# Patient Record
Sex: Female | Born: 1970
Health system: Southern US, Community
[De-identification: ages and names within clinical notes are randomized; demographics above are authoritative.]

## PROBLEM LIST (undated history)

## (undated) DIAGNOSIS — I10 Essential (primary) hypertension: Secondary | ICD-10-CM

## (undated) HISTORY — PX: ABDOMINAL HYSTERECTOMY: SHX81

## (undated) HISTORY — PX: HAND SURGERY: SHX662

## (undated) HISTORY — PX: NECK SURGERY: SHX720

## (undated) HISTORY — PX: APPENDECTOMY: SHX54

---

## 1998-05-20 ENCOUNTER — Emergency Department (HOSPITAL_COMMUNITY): Admission: EM | Admit: 1998-05-20 | Discharge: 1998-05-20 | Payer: Self-pay | Admitting: Emergency Medicine

## 2002-05-20 ENCOUNTER — Emergency Department (HOSPITAL_COMMUNITY): Admission: EM | Admit: 2002-05-20 | Discharge: 2002-05-20 | Payer: Self-pay | Admitting: Emergency Medicine

## 2002-05-20 ENCOUNTER — Encounter: Payer: Self-pay | Admitting: Emergency Medicine

## 2002-09-03 ENCOUNTER — Other Ambulatory Visit: Admission: RE | Admit: 2002-09-03 | Discharge: 2002-09-03 | Payer: Self-pay | Admitting: Obstetrics & Gynecology

## 2003-09-17 ENCOUNTER — Other Ambulatory Visit: Admission: RE | Admit: 2003-09-17 | Discharge: 2003-09-17 | Payer: Self-pay | Admitting: Obstetrics and Gynecology

## 2003-12-30 ENCOUNTER — Emergency Department (HOSPITAL_COMMUNITY): Admission: EM | Admit: 2003-12-30 | Discharge: 2003-12-30 | Payer: Self-pay | Admitting: Emergency Medicine

## 2004-10-07 ENCOUNTER — Other Ambulatory Visit: Admission: RE | Admit: 2004-10-07 | Discharge: 2004-10-07 | Payer: Self-pay | Admitting: Family Medicine

## 2005-01-20 ENCOUNTER — Encounter: Admission: RE | Admit: 2005-01-20 | Discharge: 2005-01-20 | Payer: Self-pay | Admitting: Neurological Surgery

## 2005-07-11 ENCOUNTER — Encounter
Admission: RE | Admit: 2005-07-11 | Discharge: 2005-07-11 | Payer: Self-pay | Admitting: Physical Medicine and Rehabilitation

## 2005-08-15 ENCOUNTER — Encounter
Admission: RE | Admit: 2005-08-15 | Discharge: 2005-08-15 | Payer: Self-pay | Admitting: Physical Medicine and Rehabilitation

## 2005-08-25 ENCOUNTER — Encounter
Admission: RE | Admit: 2005-08-25 | Discharge: 2005-08-25 | Payer: Self-pay | Admitting: Physical Medicine and Rehabilitation

## 2005-09-29 ENCOUNTER — Emergency Department (HOSPITAL_COMMUNITY): Admission: EM | Admit: 2005-09-29 | Discharge: 2005-09-29 | Payer: Self-pay | Admitting: Emergency Medicine

## 2006-01-05 ENCOUNTER — Other Ambulatory Visit: Admission: RE | Admit: 2006-01-05 | Discharge: 2006-01-05 | Payer: Self-pay | Admitting: Family Medicine

## 2006-02-20 ENCOUNTER — Encounter
Admission: RE | Admit: 2006-02-20 | Discharge: 2006-02-20 | Payer: Self-pay | Admitting: Physical Medicine and Rehabilitation

## 2006-03-10 ENCOUNTER — Ambulatory Visit (HOSPITAL_COMMUNITY): Admission: RE | Admit: 2006-03-10 | Discharge: 2006-03-11 | Payer: Self-pay | Admitting: Neurosurgery

## 2006-06-20 ENCOUNTER — Ambulatory Visit (HOSPITAL_COMMUNITY): Admission: RE | Admit: 2006-06-20 | Discharge: 2006-06-20 | Payer: Self-pay | Admitting: Obstetrics and Gynecology

## 2008-04-23 ENCOUNTER — Inpatient Hospital Stay (HOSPITAL_COMMUNITY): Admission: EM | Admit: 2008-04-23 | Discharge: 2008-04-25 | Payer: Self-pay | Admitting: Emergency Medicine

## 2009-09-17 ENCOUNTER — Ambulatory Visit: Payer: Self-pay | Admitting: Diagnostic Radiology

## 2009-09-17 ENCOUNTER — Emergency Department (HOSPITAL_BASED_OUTPATIENT_CLINIC_OR_DEPARTMENT_OTHER): Admission: EM | Admit: 2009-09-17 | Discharge: 2009-09-17 | Payer: Self-pay | Admitting: Emergency Medicine

## 2009-10-28 ENCOUNTER — Emergency Department (HOSPITAL_BASED_OUTPATIENT_CLINIC_OR_DEPARTMENT_OTHER): Admission: EM | Admit: 2009-10-28 | Discharge: 2009-10-28 | Payer: Self-pay | Admitting: Emergency Medicine

## 2010-04-22 ENCOUNTER — Encounter (INDEPENDENT_AMBULATORY_CARE_PROVIDER_SITE_OTHER): Payer: Self-pay | Admitting: Obstetrics and Gynecology

## 2010-04-22 ENCOUNTER — Ambulatory Visit (HOSPITAL_COMMUNITY): Admission: RE | Admit: 2010-04-22 | Discharge: 2010-04-23 | Payer: Self-pay | Admitting: Obstetrics and Gynecology

## 2010-04-28 ENCOUNTER — Inpatient Hospital Stay (HOSPITAL_COMMUNITY)
Admission: AD | Admit: 2010-04-28 | Discharge: 2010-04-30 | Payer: Self-pay | Source: Home / Self Care | Admitting: Obstetrics and Gynecology

## 2010-04-28 ENCOUNTER — Encounter: Payer: Self-pay | Admitting: Obstetrics and Gynecology

## 2010-05-06 ENCOUNTER — Inpatient Hospital Stay (HOSPITAL_COMMUNITY)
Admission: AD | Admit: 2010-05-06 | Discharge: 2010-05-06 | Payer: Self-pay | Source: Home / Self Care | Admitting: Obstetrics & Gynecology

## 2010-07-03 ENCOUNTER — Encounter: Payer: Self-pay | Admitting: Obstetrics and Gynecology

## 2010-07-04 ENCOUNTER — Encounter: Payer: Self-pay | Admitting: Obstetrics and Gynecology

## 2010-08-24 LAB — COMPREHENSIVE METABOLIC PANEL
ALT: 18 U/L (ref 0–35)
AST: 19 U/L (ref 0–37)
Albumin: 3 g/dL — ABNORMAL LOW (ref 3.5–5.2)
Albumin: 3.6 g/dL (ref 3.5–5.2)
Alkaline Phosphatase: 76 U/L (ref 39–117)
BUN: 4 mg/dL — ABNORMAL LOW (ref 6–23)
BUN: 6 mg/dL (ref 6–23)
CO2: 29 mEq/L (ref 19–32)
Calcium: 9.2 mg/dL (ref 8.4–10.5)
Chloride: 102 mEq/L (ref 96–112)
Chloride: 105 mEq/L (ref 96–112)
Creatinine, Ser: 0.83 mg/dL (ref 0.4–1.2)
Creatinine, Ser: 0.85 mg/dL (ref 0.4–1.2)
Creatinine, Ser: 0.97 mg/dL (ref 0.4–1.2)
GFR calc Af Amer: >60 mL/min (ref >60)
GFR calc non Af Amer: >60 mL/min (ref >60)
Glucose, Bld: 128 mg/dL — ABNORMAL HIGH (ref 70–99)
Glucose, Bld: 98 mg/dL (ref 70–99)
Potassium: 3.5 mEq/L (ref 3.5–5.1)
Potassium: 4 mEq/L (ref 3.5–5.1)
Potassium: 4.4 mEq/L (ref 3.5–5.1)
Sodium: 140 mEq/L (ref 135–145)
Total Bilirubin: 0.2 mg/dL — ABNORMAL LOW (ref 0.3–1.2)
Total Bilirubin: 0.6 mg/dL (ref 0.3–1.2)
Total Protein: 6.3 g/dL (ref 6.0–8.3)

## 2010-08-24 LAB — DIFFERENTIAL
Basophils Absolute: 0 10*3/uL (ref 0.0–0.1)
Basophils Absolute: 0 10*3/uL (ref 0.0–0.1)
Eosinophils Absolute: 0.4 10*3/uL (ref 0.0–0.7)
Eosinophils Relative: 4 % (ref 0–5)
Eosinophils Relative: 7 % — ABNORMAL HIGH (ref 0–5)
Lymphocytes Relative: 14 % (ref 12–46)
Lymphocytes Relative: 26 % (ref 12–46)
Monocytes Absolute: 0.3 10*3/uL (ref 0.1–1.0)
Monocytes Absolute: 0.5 10*3/uL (ref 0.1–1.0)
Monocytes Relative: 4 % (ref 3–12)
Neutro Abs: 6.1 10*3/uL (ref 1.7–7.7)

## 2010-08-24 LAB — URINE CULTURE
Colony Count: NO GROWTH
Culture  Setup Time: 201111171542
Culture: NO GROWTH
Special Requests: NEGATIVE

## 2010-08-24 LAB — URINE MICROSCOPIC-ADD ON

## 2010-08-24 LAB — CBC
HCT: 36.4 % (ref 36.0–46.0)
HCT: 38.2 % (ref 36.0–46.0)
Hemoglobin: 10.8 g/dL — ABNORMAL LOW (ref 12.0–15.0)
Hemoglobin: 12.2 g/dL (ref 12.0–15.0)
Hemoglobin: 12.8 g/dL (ref 12.0–15.0)
MCH: 28.8 pg (ref 26.0–34.0)
MCH: 28.8 pg (ref 26.0–34.0)
MCHC: 33.4 g/dL (ref 30.0–36.0)
MCHC: 33.4 g/dL (ref 30.0–36.0)
MCHC: 33.5 g/dL (ref 30.0–36.0)
MCV: 85.5 fL (ref 78.0–100.0)
MCV: 85.9 fL (ref 78.0–100.0)
Platelets: 209 10*3/uL (ref 150–400)
Platelets: 229 10*3/uL (ref 150–400)
RBC: 3.67 MIL/uL — ABNORMAL LOW (ref 3.87–5.11)
RBC: 4.43 MIL/uL (ref 3.87–5.11)
RDW: 13.7 % (ref 11.5–15.5)
RDW: 13.8 % (ref 11.5–15.5)
RDW: 14.1 % (ref 11.5–15.5)
WBC: 10.4 10*3/uL (ref 4.0–10.5)
WBC: 6.6 10*3/uL (ref 4.0–10.5)
WBC: 7.7 10*3/uL (ref 4.0–10.5)
WBC: 7.9 10*3/uL (ref 4.0–10.5)

## 2010-08-24 LAB — URINALYSIS, ROUTINE W REFLEX MICROSCOPIC
Glucose, UA: NEGATIVE mg/dL
Glucose, UA: NEGATIVE mg/dL
Ketones, ur: NEGATIVE mg/dL
Leukocytes, UA: NEGATIVE
Nitrite: NEGATIVE
Protein, ur: NEGATIVE mg/dL
Protein, ur: NEGATIVE mg/dL
Specific Gravity, Urine: 1.01 (ref 1.005–1.030)
Specific Gravity, Urine: <1.005 — ABNORMAL LOW (ref 1.005–1.030)
Urobilinogen, UA: 0.2 mg/dL (ref 0.0–1.0)

## 2010-08-24 LAB — SURGICAL PCR SCREEN: Staphylococcus aureus: NEGATIVE

## 2010-08-24 LAB — PREGNANCY, URINE: Preg Test, Ur: NEGATIVE

## 2010-08-30 LAB — DIFFERENTIAL
Basophils Absolute: 0 10*3/uL (ref 0.0–0.1)
Basophils Relative: 1 % (ref 0–1)
Monocytes Relative: 6 % (ref 3–12)
Neutro Abs: 5.9 10*3/uL (ref 1.7–7.7)
Neutrophils Relative %: 72 % (ref 43–77)

## 2010-08-30 LAB — BASIC METABOLIC PANEL
CO2: 26 mEq/L (ref 19–32)
Calcium: 8.9 mg/dL (ref 8.4–10.5)
Creatinine, Ser: 0.8 mg/dL (ref 0.4–1.2)
GFR calc Af Amer: >60 mL/min (ref >60)

## 2010-08-30 LAB — CBC
MCHC: 33.1 g/dL (ref 30.0–36.0)
RBC: 4.69 MIL/uL (ref 3.87–5.11)
WBC: 8.3 10*3/uL (ref 4.0–10.5)

## 2010-08-30 LAB — URINALYSIS, ROUTINE W REFLEX MICROSCOPIC
Glucose, UA: NEGATIVE mg/dL
Specific Gravity, Urine: 1.014 (ref 1.005–1.030)
pH: 5.5 (ref 5.0–8.0)

## 2010-08-30 LAB — URINE MICROSCOPIC-ADD ON

## 2010-08-30 LAB — URINE CULTURE: Colony Count: 60000

## 2010-09-01 LAB — URINALYSIS, ROUTINE W REFLEX MICROSCOPIC
Bilirubin Urine: NEGATIVE
Glucose, UA: NEGATIVE mg/dL
Ketones, ur: NEGATIVE mg/dL
Protein, ur: NEGATIVE mg/dL

## 2010-09-01 LAB — URINE MICROSCOPIC-ADD ON

## 2010-10-26 NOTE — H&P (Signed)
Amber Cuevas, Amber Cuevas               ACCOUNT NO.:  [id number]   MEDICAL RECORD NO.:  [id number]          PATIENT TYPE:  INP   LOCATION:  3023                         FACILITY:  [facility]   PHYSICIAN:  Kela Millin, M.D.DATE OF BIRTH:  August 01, 1970   DATE OF ADMISSION:  04/23/2008  DATE OF DISCHARGE:                              HISTORY & PHYSICAL   PRIMARY CARE PHYSICIAN:  Dr. Joselyn Arrow.   CHIEF COMPLAINT:  Worsening low back pain and fever.   HISTORY OF PRESENT ILLNESS:  The patient is a 40 year old black female  with history of low back pain for the past several months, recently  diagnosed with urinary tract infection, depression and she followed up  with her primary care physician today and had a fever up to 101 per her  report, and a urinalysis was done at the office which revealed  hematuria, and so she was asked to come to the ER.  She reports that her  low back pain has worsened in the past couple of days, about 7/10 in  intensity and now radiating down her left lower extremity, mostly  (although sometimes radiates down both legs).  She denies any trauma, it  is noted that she has a history of C5-C6 herniated disk and status post  surgery per Dr. Gerlene Fee in September 2007.  [name] also reports that  5 days ago she saw the PA at her PCP's office and was diagnosed with  depression/anxiety and was started on Lexapro, and she thinks lorazepam  as well.  Prior to that she was having problems with decreased p.o.  intake and just not feeling well overall.  That same day after seeing  the PA, she later had to go to the urgent care clinic because she was  continuing to be symptomatic.  A urinalysis was done at the Urgent  Care/walk-in clinic per the patient's report, and she was told she had a  urinary tract infection and started on an antibiotic (she does not  remember its name).  Shortly after she began taking the antidepressant  and antibiotic, she began having nausea and  vomiting.  She states that  the nausea and vomiting has continued for the past 5 days - about three  times a day which is usually whenever she tries to eat anything.  She  states that it is nonbloody.  She went for follow-up today as already  mentioned and was found to be febrile and a urinalysis was repeated  which showed hematuria.  So, she was asked to come to the ED as already  mentioned above.  In the emergency room she had a urinalysis done which  showed moderate hemoglobin, with only 0-2 RBCs noted, with a specific  gravity of 1.035 and otherwise negative for infection, her white cell  count was 2.4 and a CT scan of the abdomen and pelvis was done which  showed no acute abdominal findings.  No nephrolithiasis also no  hydronephrosis.  She appeared clinically volume depleted and was  continuing to vomit in the ER, and so is admitted for further evaluation  and  management.  She denies cough, dysuria, diarrhea, hematemesis,  melena and no hematochezia.   PAST MEDICAL HISTORY:  1. As above.  2. History of C5-C6 right herniated disk - status post surgery.   MEDICATIONS:  1. Lexapro.  2. (?) Lorazepam.  3. Antibiotic - does not know the name, has been taking it for the      past 5 days.   ALLERGIES:  NKDA.   SOCIAL HISTORY:  She denies tobacco.  She also denies alcohol.   FAMILY HISTORY:  Positive for diabetes in her dad and sister, and one  sister also with hypertension.  Her mother died of stomach cancer.   REVIEW OF SYSTEMS:  As per HPI, other review of systems negative.   PHYSICAL EXAMINATION:  GENERAL:  The patient is a young black female,  appears to be in moderate distress secondary to pain, no respiratory  distress.  VITAL SIGNS:  Her temperature is 98.8, blood pressure 101/72, pulse is  98, respiratory rate is 20, O2 sat is 99%.  HEENT:  PERRL, EOMI, dry mucous membranes, no oral exudates.  NECK:  Supple, no adenopathy, no thyromegaly and no JVD.  LUNGS:  Clear to  auscultation bilaterally.  No crackles or wheezes.  CARDIOVASCULAR:  Regular rate and rhythm.  Normal S1-S2.  ABDOMEN:  Soft, bowel sounds present, nontender, nondistended.  No  organomegaly and no masses palpable.  BACK:  Tender to palpation in lumbar spine, also paraspinal tenderness  in the lumbar area, greater on the left.  EXTREMITIES:  No cyanosis and no edema.  NEUROLOGICAL:  She is alert and oriented x3.  Cranial Nerves: II to XII  grossly intact.  Her strength is 4-5/5 and symmetric.  Sensory grossly  intact.  Pain with straight leg raise to about 45 degrees on the right,  and to about 40 degrees on the left.   LABORATORY DATA:  CT scan as per HPI.  Her white cell count is 2.4,  hemoglobin is 14.28 not to rate is 42.5, platelet count is 162,  neutrophil count is 77%.  Sodium is 136, potassium is 3.7, chloride is  105, CO2 is 22, glucose 90, BUN is 8, creatinine is 1.06, calcium is  9.1, AST is 24, ALT is 24, albumin is 4.2.   Urinalysis as per HPI.   ASSESSMENT AND PLAN:  1. Worsening low back pain with fever at the office.  As discussed      above, the patient recently diagnosed with urinary tract infection      and status post antibiotics times 5 days.  Urinalysis now      unremarkable for infection and a CT scan of abdomen and pelvis is      negative as above.  Will obtain MRI of lumbar spine, recheck urine      cultures, hold off antibiotics for now as she is hemodynamically      stable, follow and further manage as appropriate pending above      studies.  2. Nausea and vomiting with the patient clinically volume depleted.      (?)Secondary to above versus medications as discussed above.      Again, the CT scan of the abdomen and pelvis without contrast is      negative.  Will obtain serum lipase, hold Lexapro for now and then      follow above studies.  3. Depression/anxiety.  Continue Ativan p.r.n., hold Lexapro for now.      Follow and consider all tentative  antidepressant.  4. Recent urinary tract infection.  As above, follow recheck cultures      and hold off antibiotics for now as discussed above.  5. History of C5-C6 herniated disk.  Status post surgery in 2007 per      Dr. Gerlene Fee.  6. Leukopenia.  As above, follow urine cultures, obtain blood cultures      if temperature greater than 100.4, also follow and recheck white      cell count and further evaluate/manage as appropriate.      Kela Millin, M.D.  Electronically Signed     ACV/MEDQ  D:  04/24/2008  T:  04/24/2008  Job:  [id number]   cc:   Lavonda Jumbo, M.D.

## 2010-10-29 NOTE — Op Note (Signed)
Amber Cuevas, Amber Cuevas NO.:  [id number]   MEDICAL RECORD NO.:  [id number]          PATIENT TYPE:  OIB   LOCATION:  3023                         FACILITY:  [facility]   PHYSICIAN:  Reinaldo Meeker, M.D. DATE OF BIRTH:  1971/01/16   DATE OF PROCEDURE:  DATE OF DISCHARGE:  03/11/2006                                 OPERATIVE REPORT   DATE OF SURGERY:  March 10, 2006.   PREOPERATIVE DIAGNOSIS:  Herniated disk C5-6 right.   POSTOPERATIVE DIAGNOSIS:  Herniated disk, C5-6 right.   PROCEDURE PERFORMED:  C5-6 anterior cervical diskectomy with bone bank  fusion, followed by Mystique anterior cervical plating.   SURGEON:  Reinaldo Meeker, M.D.   ASSISTANT:  Tia Alert, MD   PROCEDURE IN DETAIL:  After being placed in the supine position in 5 pounds  halter traction, the patient's neck was prepped and draped in the usual  sterile fashion.  Localizing x-rays taken prior to incision identified the  appropriate level.  A transverse incision was made in the right anterior  neck starting at the midline and heading towards the medial aspect of the  sternocleidomastoid muscle.  The platysma muscle was then incised  transversely.  The natural fascial plane between the strap muscles medially  and sternocleidomastoid laterally was identified and followed down to the  anterior aspect of the cervical spine.  Longus colli muscles were  identified, split in the midline, stripped away bilaterally with Metallurgist.  A second x-ray was taken which showed approach  at the appropriate level.  Using a 15 blade the  annulus of the disk  was  incised.  Using pituitary rongeurs and curets, approximately 90% of the disk  material was removed.  High-speed drill was used to widen the interspace.  The microscope was draped and brought into the field and used for the  remainder of  the case.  Using microdissection technique, the remainder of  the disk material  down  to the posterior longitudinal ligament was removed..  The ligament was incised transversely and the cut edge removed with Kerrison  punch..  __________  disk material was identified towards the right side and  this was removed and then the right C6 nerve root was tracked out until it  well decompressed.  Similar decompression was then carried out towards the  left, asymptomatic side.  At this time, inspection was carried out in all  directions for the evidence of residual compression  noted to be identified.  Large amounts of irrigation were carried out.  Any bleeding was controlled  with bipolar coagulation and Gelfoam.  Measurements were taken and a 7  millimeter bone bank  plug was  reconstituted.  After irrigating once more  and confirming hemostasis,  the plug was impacted without difficulty.  Fluoroscopy showed it to be in good position.  An appropriate length  Mystique anterior cervical plate was chosen.  Under fluoroscopic guidance  drill holes were placed followed by tapping and placing 13 mm screws x4.  Final fluoroscopy showed the plate screws and plug to  be in good position.  Large amounts of irrigation carried out and any bleeding controlled with  bipolar  coagulation and Gelfoam. The wound was then closed using interrupted Vicryl  on the platysma muscle, inverted 5-0 PDS on the subcu layer, and Steri-  Strips on the skin.  A sterile dressing and soft collar applied.  The  patient was extubated and taken to recovery room in stable condition.           ______________________________  Reinaldo Meeker, M.D.     ROK/MEDQ  D:  03/10/2006  T:  03/12/2006  Job:  [id number]

## 2010-10-29 NOTE — Discharge Summary (Signed)
NAMEMORRIS, MARKHAM               ACCOUNT NO.:  [id number]   MEDICAL RECORD NO.:  [id number]          PATIENT TYPE:  INP   LOCATION:  3023                         FACILITY:  [facility]   PHYSICIAN:  Corinna L. Lendell Caprice, MDDATE OF BIRTH:  01-26-1971   DATE OF ADMISSION:  04/23/2008  DATE OF DISCHARGE:  04/25/2008                               DISCHARGE SUMMARY   DISCHARGE DIAGNOSES:  1. Vomiting.  2. Dehydration.  3. Depression and anxiety.  4. Urinary tract infection.  5. Low back pain.  6. Neutropenia.   DISCHARGE MEDICATIONS:  1. Continue lorazepam as needed.  2. Lexapro.  3. Start Skelaxin 800 mg t.i.d. as needed for muscle spasm.  4. Vicodin one every 4 hours as needed for pain.  5. Phenergan 12.5 mg every 6 hours as needed for nausea.   FOLLOWUP:  Follow up with Dr. Joselyn Arrow if no improvement in symptoms.   DIET:  Push fluids.   CONDITION:  Stable.   ACTIVITY:  Ad lib.   PROCEDURES:  None.   CONSULTATIONS:  None.   Initial CBC was significant for white blood cell count of 2.4.  Her  white blood cell count dropped to a low of 1.7 and at discharge is 2.7.  She had normal differential.  Platelet count on admission was normal,  dropped to a low of 124 with IV hydration and increased to 162 at  discharge.  Basic metabolic panel unremarkable.  Lactic acid, lipase,  and LFTs unremarkable.  Urinalysis showed moderate hemoglobin, small  bilirubin, greater than 80 ketones, 30 protein, negative nitrite,  negative leukocyte esterase, many bacteria, 0-2 white cells, and 0-2 red  cells.  Blood cultures negative.  Urine culture grew out 15,000 colonies  of multiple bacterial morphotypes.   SPECIAL STUDIES:  Radiology:  CT of the abdomen and pelvis showed no  nephrolithiasis or hydronephrosis, small bilateral pleural effusions,  otherwise unremarkable.  MRI of the lumbar spine showed a benign L2  hemangioma, otherwise unremarkable.   HISTORY AND HOSPITAL COURSE:  Ms. Berdan is  a 40 year old black female  who had back pain and fever.  She was recently diagnosed and treated for  urinary tract infection.  She was on Bactrim, but she continued to have  fevers up to 101 per her report.  The urinalysis was done in the office,  which showed hematuria, so she was sent to the emergency room.  She also  was vomiting and thought it may be related to her Lexapro.  She appeared  to be in a moderate amount of pain on admission.  She had normal vital  signs.  She had some murmur and paraspinal tenderness.  Abdomen was soft  and nontender.  She had pain with straight leg raise 45 degrees on the  right and 40 degrees on the left.  She was admitted for workup.  She was  also noted to appear clinically dehydrated with dry mucous membranes.  She was given IV fluids, pain medications, and antiemetics.  Her Bactrim  was held as was her antidepressant.  She had no further significant  fevers.  She did drop her white count and platelet count, but these  improved.  Her nausea and vomiting improved and she was able to be  discharged home.  I suspect her fever is secondary to resolving urinary  tract infection, but no pyelonephritis was seen on CAT scan.  Her back  pain is musculoskeletal.  She was tolerating a diet, feeling better and  able to be discharged home.      Corinna L. Lendell Caprice, MD  Electronically Signed     CLS/MEDQ  D:  06/19/2008  T:  06/20/2008  Job:  [id number]

## 2010-11-25 ENCOUNTER — Other Ambulatory Visit: Payer: Self-pay | Admitting: Orthopedic Surgery

## 2010-11-25 DIAGNOSIS — M542 Cervicalgia: Secondary | ICD-10-CM

## 2010-11-29 ENCOUNTER — Ambulatory Visit
Admission: RE | Admit: 2010-11-29 | Discharge: 2010-11-29 | Disposition: A | Discharge status: Home / Self Care | Payer: Managed Care, Other (non HMO) | Source: Ambulatory Visit | Attending: Orthopedic Surgery | Admitting: Orthopedic Surgery

## 2010-11-29 DIAGNOSIS — M542 Cervicalgia: Secondary | ICD-10-CM

## 2010-12-13 ENCOUNTER — Ambulatory Visit (HOSPITAL_BASED_OUTPATIENT_CLINIC_OR_DEPARTMENT_OTHER)
Admission: RE | Admit: 2010-12-13 | Discharge: 2010-12-13 | Disposition: A | Discharge status: Home / Self Care | Payer: Managed Care, Other (non HMO) | Source: Ambulatory Visit | Attending: Orthopedic Surgery | Admitting: Orthopedic Surgery

## 2010-12-13 DIAGNOSIS — F329 Major depressive disorder, single episode, unspecified: Secondary | ICD-10-CM | POA: Insufficient documentation

## 2010-12-13 DIAGNOSIS — G5600 Carpal tunnel syndrome, unspecified upper limb: Secondary | ICD-10-CM | POA: Insufficient documentation

## 2010-12-13 DIAGNOSIS — F3289 Other specified depressive episodes: Secondary | ICD-10-CM | POA: Insufficient documentation

## 2010-12-13 DIAGNOSIS — Z01812 Encounter for preprocedural laboratory examination: Secondary | ICD-10-CM | POA: Insufficient documentation

## 2011-01-04 NOTE — Op Note (Signed)
NAMEMarland Kitchen  Amber Cuevas, Amber Cuevas NO.:  [id number]  MEDICAL RECORD NO.:  [id number]  LOCATION:                                 FACILITY:  PHYSICIAN:  Jones Broom, MD    DATE OF BIRTH:  05/16/1971  DATE OF PROCEDURE:  12/13/2010 DATE OF DISCHARGE:                              OPERATIVE REPORT   PREOPERATIVE DIAGNOSIS:  Right carpal tunnel syndrome.  POSTOPERATIVE DIAGNOSIS:  Right carpal tunnel syndrome.  PROCEDURE PERFORMED:  Right open carpal tunnel release.  ATTENDING SURGEON:  Jones Broom, MD  ASSISTANT:  None.  ANESTHESIA:  GETA.  COMPLICATIONS:  None.  DRAINS:  None.  SPECIMENS:  None.  ESTIMATED BLOOD LOSS:  Minimal.  TOURNIQUET TIME:  15 minutes at 250 mmHg.  INDICATIONS FOR SURGERY:  Amber Cuevas is a 40 year old right-hand-dominant female who has had several-month history of right wrist and arm pain, which was worked up by Dr. Renae Fickle and Dr. Regino Schultze and felt to be consistent with carpal tunnel syndrome, although there was some overlap with C6 radicular symptoms.  She had carpal tunnel injection, which was not helpful and also had an epidural steroid injection, which did not help. On exam, her symptoms seemed to be reproduced with carpal tunnel provocative test.  She wished to go ahead with carpal tunnel release.  I felt that this was reasonable given the reproductions of her symptoms with provocative carpal tunnel testing; however, I did advise her that given the nonstandard presentation of symptoms that she may not see complete relief with carpal tunnel release and certainty could have some component of double crush phenomenon.  She elected to go forward with surgery and understood risks, benefits and alternatives.  PROCEDURE:  The patient was identified in the preoperative holding area where I personally marked the operative site after verifying site, side and procedure with the patient.  She was taken back to the operating room where general  anesthesia was induced without complication.  The right upper extremity was prepped and draped in the standard sterile fashion after a nonsterile tourniquet was applied to the upper arm.  The limb was exsanguinated using an Esmarch dressing and the tourniquet was elevated at 250 mmHg.  A 3-cm incision was made in line with the webspace between the third and fourth digit extending from the dominant flexion crease distally.  Dissection was carried down through skin to the palmar fascia, which was split longitudinally in line with the incision.  This was taken down to the level of the transcarpal ligament, which was identified and a small rent in the ligament was made with the 15-blade.  A Freer elevator was then passed proximally and distally beneath the ligament to protect the underlying contents while a 15-blade was used to release the ligaments.  Careful retraction was then used proximally and distally to visualize the extent of the ligament and the remainder of the release was done using tenotomies under direct visualization.  Distally, the fat pad marked the palmar arch and this was not entered.  Complete release of the ligament was noted with wide separation at the conclusion of the procedure.  The nerve was identified and was healthy appearing.  The wound  was then copiously irrigated with normal saline and skin was closed with 4-0 nylon in an interrupted simple suture fashion.  The wound was then infiltrated with 10 mL of 0.25% Marcaine without epinephrine.  Sterile dressings were then applied including, Adaptic, 4x4s, Kerlix wrap, and a light Coban wrap with dressings between the fingers.  Tourniquet was let down for total tourniquet time of 15 minutes at 250 mmHg.  The patient was then allowed to awaken from general anesthesia, transferred to the stretcher and taken to the recovery room in stable condition.  POSTOPERATIVE PLAN:  She will be discharged to home in stable  condition with her family today.  She will follow up in 7-10 days for suture removal and wound check.     Jones Broom, MD     JC/MEDQ  D:  12/13/2010  T:  12/14/2010  Job:  [id number]  Electronically Signed by Jones Broom  on 01/04/2011 03:57:04 PM

## 2011-03-15 LAB — URINALYSIS, ROUTINE W REFLEX MICROSCOPIC
Glucose, UA: NEGATIVE
Ketones, ur: >80 — AB
Leukocytes, UA: NEGATIVE
Nitrite: NEGATIVE
Protein, ur: 30 — AB
Specific Gravity, Urine: 1.035 — ABNORMAL HIGH
Urobilinogen, UA: 1
pH: 6

## 2011-03-15 LAB — CBC
HCT: 35.2 — ABNORMAL LOW
HCT: 37.5
HCT: 42.5
Hemoglobin: 11.8 — ABNORMAL LOW
Hemoglobin: 11.9 — ABNORMAL LOW
Hemoglobin: 14.2
MCHC: 33.3
MCHC: 33.7
MCHC: 33.8
MCHC: 33.8
MCV: 85
MCV: 86
Platelets: 162
Platelets: 162
RBC: 4.1
RBC: 4.11
RBC: 4.41
RBC: 4.94
RDW: 14.2
RDW: 14.5
WBC: 1.7 — ABNORMAL LOW
WBC: 2.4 — ABNORMAL LOW
WBC: 2.7 — ABNORMAL LOW

## 2011-03-15 LAB — LIPASE, BLOOD: Lipase: 19

## 2011-03-15 LAB — COMPREHENSIVE METABOLIC PANEL
ALT: 24
AST: 24
Albumin: 4.2
Alkaline Phosphatase: 81
BUN: 8
CO2: 22
Calcium: 9.1
Chloride: 105
Creatinine, Ser: 1.06
GFR calc Af Amer: >60
GFR calc non Af Amer: 58 — ABNORMAL LOW
Glucose, Bld: 90
Potassium: 3.7
Sodium: 136
Total Bilirubin: 0.9
Total Protein: 7.1

## 2011-03-15 LAB — DIFFERENTIAL
Basophils Absolute: 0
Basophils Absolute: 0
Basophils Relative: 0
Basophils Relative: 0
Basophils Relative: 1
Eosinophils Absolute: 0
Eosinophils Absolute: 0.1
Eosinophils Absolute: 0.1
Eosinophils Relative: 1
Eosinophils Relative: 3
Lymphocytes Relative: 13
Lymphocytes Relative: 20
Lymphocytes Relative: 34
Lymphs Abs: 0.3 — ABNORMAL LOW
Lymphs Abs: 0.3 — ABNORMAL LOW
Lymphs Abs: 0.6 — ABNORMAL LOW
Lymphs Abs: 1.1
Monocytes Absolute: 0.2
Monocytes Absolute: 0.2
Monocytes Relative: 11
Monocytes Relative: 12
Monocytes Relative: 9
Neutro Abs: 0.9 — ABNORMAL LOW
Neutro Abs: 1.1 — ABNORMAL LOW
Neutro Abs: 1.8
Neutrophils Relative %: 44
Neutrophils Relative %: 66
Neutrophils Relative %: 77

## 2011-03-15 LAB — URINE MICROSCOPIC-ADD ON

## 2011-03-15 LAB — CULTURE, BLOOD (ROUTINE X 2)
Culture: NO GROWTH
Culture: NO GROWTH

## 2011-03-15 LAB — URINE CULTURE

## 2011-03-15 LAB — POCT PREGNANCY, URINE: Preg Test, Ur: NEGATIVE

## 2011-03-15 LAB — LACTIC ACID, PLASMA: Lactic Acid, Venous: 1

## 2011-03-30 ENCOUNTER — Other Ambulatory Visit: Payer: Self-pay | Admitting: Obstetrics and Gynecology

## 2011-09-12 ENCOUNTER — Encounter (HOSPITAL_BASED_OUTPATIENT_CLINIC_OR_DEPARTMENT_OTHER): Payer: Self-pay | Admitting: *Deleted

## 2011-09-12 ENCOUNTER — Emergency Department (HOSPITAL_BASED_OUTPATIENT_CLINIC_OR_DEPARTMENT_OTHER)
Admission: EM | Admit: 2011-09-12 | Discharge: 2011-09-12 | Disposition: A | Discharge status: Home / Self Care | Payer: Managed Care, Other (non HMO) | Attending: Emergency Medicine | Admitting: Emergency Medicine

## 2011-09-12 DIAGNOSIS — IMO0001 Reserved for inherently not codable concepts without codable children: Secondary | ICD-10-CM | POA: Insufficient documentation

## 2011-09-12 DIAGNOSIS — L089 Local infection of the skin and subcutaneous tissue, unspecified: Secondary | ICD-10-CM

## 2011-09-12 DIAGNOSIS — W57XXXA Bitten or stung by nonvenomous insect and other nonvenomous arthropods, initial encounter: Secondary | ICD-10-CM

## 2011-09-12 MED ORDER — CEPHALEXIN 500 MG PO CAPS: 500.0000 mg | ORAL_CAPSULE | Freq: Four times a day (QID) | ORAL | Status: AC

## 2011-09-12 NOTE — ED Provider Notes (Signed)
History     CSN: [id number]  Arrival date & time 09/12/11  1044   First MD Initiated Contact with Patient 09/12/11 1056      Chief Complaint  Patient presents with  . Insect Bite    (Consider location/radiation/quality/duration/timing/severity/associated sxs/prior treatment) HPI  History reviewed. No pertinent past medical history.  Past Surgical History  Procedure Date  . Abdominal hysterectomy   . Appendectomy   . Neck surgery     History reviewed. No pertinent family history.  History  Substance Use Topics  . Smoking status: Never Smoker   . Smokeless tobacco: Not on file  . Alcohol Use: No    OB History    Grav Para Term Preterm Abortions TAB SAB Ect Mult Living                  Review of Systems  Allergies  Codeine  Home Medications   Current Outpatient Rx  Name Route Sig Dispense Refill  . ESTRADIOL 2 MG PO TABS Oral Take 2 mg by mouth daily.    . CEPHALEXIN 500 MG PO CAPS Oral Take 1 capsule (500 mg total) by mouth 4 (four) times daily. 28 capsule 0    BP 128/83  Pulse 76  Temp(Src) 98.5 F (36.9 C) (Oral)  Resp 20  Ht 5\' 3"  (1.6 m)  Wt 178 lb (80.74 kg)  BMI 31.53 kg/m2  SpO2 100%  Physical Exam  ED Course  Procedures (including critical care time)  Labs Reviewed - No data to display No results found.   1. Insect bite   2. Skin infection       MDM          Nelia Shi, MD 09/12/11 1116

## 2011-09-12 NOTE — Discharge Instructions (Signed)
Insect Bite Mosquitoes, flies, fleas, bedbugs, and many other insects can bite. Insect bites are different from insect stings. A sting is when venom is injected into the skin. Some insect bites can transmit infectious diseases. SYMPTOMS  Insect bites usually turn red, swell, and itch for 2 to 4 days. They often go away on their own. TREATMENT  Your caregiver may prescribe antibiotic medicines if a bacterial infection develops in the bite. HOME CARE INSTRUCTIONS  Do not scratch the bite area.   Keep the bite area clean and dry. Wash the bite area thoroughly with soap and water.   Put ice or cool compresses on the bite area.   Put ice in a plastic bag.   Place a towel between your skin and the bag.   Leave the ice on for 20 minutes, 4 times a day for the first 2 to 3 days, or as directed.   You may apply a baking soda paste, cortisone cream, or calamine lotion to the bite area as directed by your caregiver. This can help reduce itching and swelling.   Only take over-the-counter or prescription medicines as directed by your caregiver.   If you are given antibiotics, take them as directed. Finish them even if you start to feel better.  You may need a tetanus shot if:  You cannot remember when you had your last tetanus shot.   You have never had a tetanus shot.   The injury broke your skin.  If you get a tetanus shot, your arm may swell, get red, and feel warm to the touch. This is common and not a problem. If you need a tetanus shot and you choose not to have one, there is a rare chance of getting tetanus. Sickness from tetanus can be serious. SEEK IMMEDIATE MEDICAL CARE IF:   You have increased pain, redness, or swelling in the bite area.   You see a red line on the skin coming from the bite.   You have a fever.   You have joint pain.   You have a headache or neck pain.   You have unusual weakness.   You have a rash.   You have chest pain or shortness of breath.   You  have abdominal pain, nausea, or vomiting.   You feel unusually tired or sleepy.  MAKE SURE YOU:   Understand these instructions.   Will watch your condition.   Will get help right away if you are not doing well or get worse.  Document Released: 07/07/2004 Document Revised: 05/19/2011 Document Reviewed: 12/29/2010 [employer] Patient Information 2012 [employer]. 

## 2011-09-12 NOTE — ED Notes (Signed)
Pt woke up yesterday am with area on right lower arm area looked like a small mosquito bite this morning larger redder and area has hardened small amount of drainage reported

## 2012-03-08 ENCOUNTER — Emergency Department (HOSPITAL_BASED_OUTPATIENT_CLINIC_OR_DEPARTMENT_OTHER): Payer: Managed Care, Other (non HMO)

## 2012-03-08 ENCOUNTER — Emergency Department (HOSPITAL_BASED_OUTPATIENT_CLINIC_OR_DEPARTMENT_OTHER)
Admission: EM | Admit: 2012-03-08 | Discharge: 2012-03-08 | Disposition: A | Discharge status: Home / Self Care | Payer: Managed Care, Other (non HMO) | Attending: Emergency Medicine | Admitting: Emergency Medicine

## 2012-03-08 ENCOUNTER — Encounter (HOSPITAL_BASED_OUTPATIENT_CLINIC_OR_DEPARTMENT_OTHER): Payer: Self-pay | Admitting: Emergency Medicine

## 2012-03-08 DIAGNOSIS — R079 Chest pain, unspecified: Secondary | ICD-10-CM | POA: Insufficient documentation

## 2012-03-08 DIAGNOSIS — M79603 Pain in arm, unspecified: Secondary | ICD-10-CM

## 2012-03-08 DIAGNOSIS — I10 Essential (primary) hypertension: Secondary | ICD-10-CM | POA: Insufficient documentation

## 2012-03-08 DIAGNOSIS — M79609 Pain in unspecified limb: Secondary | ICD-10-CM | POA: Insufficient documentation

## 2012-03-08 HISTORY — DX: Essential (primary) hypertension: I10

## 2012-03-08 LAB — CBC WITH DIFFERENTIAL/PLATELET
Lymphocytes Relative: 22 % (ref 12–46)
Lymphs Abs: 1.9 10*3/uL (ref 0.7–4.0)
MCH: 28.2 pg (ref 26.0–34.0)
MCV: 83.1 fL (ref 78.0–100.0)
Monocytes Absolute: 0.7 10*3/uL (ref 0.1–1.0)
Monocytes Relative: 8 % (ref 3–12)
Neutro Abs: 5.9 10*3/uL (ref 1.7–7.7)
Platelets: 285 10*3/uL (ref 150–400)
RDW: 13.4 % (ref 11.5–15.5)

## 2012-03-08 LAB — COMPREHENSIVE METABOLIC PANEL
Alkaline Phosphatase: 77 U/L (ref 39–117)
BUN: 14 mg/dL (ref 6–23)
CO2: 25 mEq/L (ref 19–32)
Chloride: 97 mEq/L (ref 96–112)
Creatinine, Ser: 1 mg/dL (ref 0.50–1.10)
GFR calc non Af Amer: 68 mL/min — ABNORMAL LOW (ref >90)
Glucose, Bld: 98 mg/dL (ref 70–99)
Total Bilirubin: 0.3 mg/dL (ref 0.3–1.2)

## 2012-03-08 MED ORDER — ACETAMINOPHEN 325 MG PO TABS
650.0000 mg | ORAL_TABLET | Freq: Once | ORAL | Status: AC
  Administered 2012-03-08: 650 mg via ORAL
  Filled 2012-03-08: qty 2

## 2012-03-08 MED ORDER — HYDROCODONE-ACETAMINOPHEN 5-325 MG PO TABS: 1.0000 | ORAL_TABLET | Freq: Three times a day (TID) | ORAL | Status: DC | PRN

## 2012-03-08 NOTE — ED Notes (Signed)
States this am started having pain in her right arm.  Earlier this week had same episode that also involved her left leg.  Also reports vomiting x3 today.  Was put on BP medications Monday.

## 2012-03-08 NOTE — ED Notes (Signed)
MD at bedside. 

## 2012-03-08 NOTE — ED Provider Notes (Signed)
History     CSN: [id number]  Arrival date & time 03/08/12  1509   First MD Initiated Contact with Patient 03/08/12 1534      Chief Complaint  Patient presents with  . Arm Pain  . Emesis    HPI  The patient p/w multiple complaints.  She notes migratory pain over the past week; currently she has pain throughout the entire L UE.  The pain is sore, radiating inferiorly and superiorly, worse w motion.  No relief with anything. She notes associated "off" sensation in her chest, with no dyspnea or "pain." She states that she initiated BP meds this week, and has been compliant with her regimen. She has a Hx of cervical spine herniated disc s/p surgery several years ago.  Past Medical History  Diagnosis Date  . Hypertension     Past Surgical History  Procedure Date  . Abdominal hysterectomy   . Appendectomy   . Neck surgery     No family history on file.  History  Substance Use Topics  . Smoking status: Never Smoker   . Smokeless tobacco: Not on file  . Alcohol Use: No    OB History    Grav Para Term Preterm Abortions TAB SAB Ect Mult Living                  Review of Systems  Constitutional:       HPI  HENT:       HPI otherwise negative  Eyes: Negative.   Respiratory:       HPI, otherwise negative  Cardiovascular:       HPI, otherwise nmegative  Gastrointestinal: Negative for vomiting.  Genitourinary:       HPI, otherwise negative  Musculoskeletal:       HPI, otherwise negative  Skin: Negative.   Neurological: Negative for syncope.    Allergies  Codeine  Home Medications   Current Outpatient Rx  Name Route Sig Dispense Refill  . TRIAMTERENE-HCTZ 37.5-25 MG PO CAPS Oral Take 1 capsule by mouth every morning.    Marland Kitchen ESTRADIOL 2 MG PO TABS Oral Take 2 mg by mouth daily.      BP 117/95  Pulse 87  Temp 97.5 F (36.4 C) (Oral)  Resp 16  Ht 5\' 3"  (1.6 m)  Wt 172 lb (78.019 kg)  BMI 30.47 kg/m2  SpO2 100%  Physical Exam  Nursing note and vitals  reviewed. Constitutional: She is oriented to person, place, and time. She appears well-developed and well-nourished. No distress.  HENT:  Head: Normocephalic and atraumatic.  Eyes: Conjunctivae normal and EOM are normal.  Cardiovascular: Normal rate and regular rhythm.   Pulmonary/Chest: Effort normal and breath sounds normal. No stridor. No respiratory distress.  Abdominal: She exhibits no distension.  Musculoskeletal: She exhibits no edema.       Right shoulder: Normal.       Left shoulder: Normal.       Right elbow: Normal.      Left elbow: Normal.       Right wrist: Normal.       Left wrist: Normal.       Left upper arm: Normal.       Left forearm: Normal.       Left hand: Normal. normal sensation noted. Normal strength noted.       The patient describes deeper pain within the L upper and lower arm. Dimensions and color of the arms are appropriate / symmetric.  Neurological: She  is alert and oriented to person, place, and time. No cranial nerve deficit.  Skin: Skin is warm and dry.  Psychiatric: She has a normal mood and affect.    ED Course  Procedures (including critical care time)   Labs Reviewed  CBC WITH DIFFERENTIAL  COMPREHENSIVE METABOLIC PANEL   No results found.   No diagnosis found.   Date: 03/08/2012  Rate: 76  Rhythm: normal sinus rhythm  QRS Axis: normal  Intervals: normal  ST/T Wave abnormalities: normal  Conduction Disutrbances: none  Narrative Interpretation: unremarkable   XR reviewed:    MDM  The patient presents with migratory complaints and new left arm pain.  On exam the patient was in no distress, with no focal deficiencies about her affected extremity.  Given the patient's history of prior cervical spine pathology there suspicion of radiculopathy.  The patient also complained of any usual physician chest.  The patient doesn't smoke and her labs and x-ray were largely reassuring.  Absent acute vital sign changes, with stable lab values,  and an available primary care physician followup with the patient was discharged in stable condition with return precautions, follow up instructions.    Gerhard Munch, MD 03/08/12 1756

## 2012-10-26 ENCOUNTER — Emergency Department (HOSPITAL_BASED_OUTPATIENT_CLINIC_OR_DEPARTMENT_OTHER)
Admission: EM | Admit: 2012-10-26 | Discharge: 2012-10-26 | Disposition: A | Discharge status: Home / Self Care | Payer: Managed Care, Other (non HMO) | Attending: Emergency Medicine | Admitting: Emergency Medicine

## 2012-10-26 ENCOUNTER — Emergency Department (HOSPITAL_BASED_OUTPATIENT_CLINIC_OR_DEPARTMENT_OTHER): Payer: Managed Care, Other (non HMO)

## 2012-10-26 ENCOUNTER — Encounter (HOSPITAL_BASED_OUTPATIENT_CLINIC_OR_DEPARTMENT_OTHER): Payer: Self-pay

## 2012-10-26 DIAGNOSIS — R0789 Other chest pain: Secondary | ICD-10-CM | POA: Insufficient documentation

## 2012-10-26 DIAGNOSIS — K8020 Calculus of gallbladder without cholecystitis without obstruction: Secondary | ICD-10-CM | POA: Insufficient documentation

## 2012-10-26 DIAGNOSIS — M25519 Pain in unspecified shoulder: Secondary | ICD-10-CM | POA: Insufficient documentation

## 2012-10-26 DIAGNOSIS — I10 Essential (primary) hypertension: Secondary | ICD-10-CM | POA: Insufficient documentation

## 2012-10-26 LAB — CBC WITH DIFFERENTIAL/PLATELET
Basophils Absolute: 0 10*3/uL (ref 0.0–0.1)
Lymphocytes Relative: 28 % (ref 12–46)
Lymphs Abs: 1.9 10*3/uL (ref 0.7–4.0)
MCV: 85 fL (ref 78.0–100.0)
Neutro Abs: 4.5 10*3/uL (ref 1.7–7.7)
Neutrophils Relative %: 66 % (ref 43–77)
Platelets: 231 10*3/uL (ref 150–400)
RBC: 4.81 MIL/uL (ref 3.87–5.11)
WBC: 6.9 10*3/uL (ref 4.0–10.5)

## 2012-10-26 LAB — COMPREHENSIVE METABOLIC PANEL
AST: 20 U/L (ref 0–37)
BUN: 11 mg/dL (ref 6–23)
CO2: 24 mEq/L (ref 19–32)
Calcium: 9.6 mg/dL (ref 8.4–10.5)
Creatinine, Ser: 0.9 mg/dL (ref 0.50–1.10)
GFR calc non Af Amer: 77 mL/min — ABNORMAL LOW (ref >90)

## 2012-10-26 LAB — LIPASE, BLOOD: Lipase: 17 U/L (ref 11–59)

## 2012-10-26 LAB — TROPONIN I: Troponin I: <0.3 ng/mL (ref <0.30)

## 2012-10-26 MED ORDER — IBUPROFEN 800 MG PO TABS
800.0000 mg | ORAL_TABLET | Freq: Once | ORAL | Status: AC
  Administered 2012-10-26: 800 mg via ORAL
  Filled 2012-10-26: qty 1

## 2012-10-26 MED ORDER — HYDROCODONE-ACETAMINOPHEN 5-325 MG PO TABS: 1.0000 | ORAL_TABLET | Freq: Four times a day (QID) | ORAL | Status: DC | PRN

## 2012-10-26 NOTE — ED Provider Notes (Signed)
History     CSN: [id number]  Arrival date & time 10/26/12  1137   First MD Initiated Contact with Patient 10/26/12 1312      Chief Complaint  Patient presents with  . Chest Pain    (Consider location/radiation/quality/duration/timing/severity/associated sxs/prior treatment) HPI Comments: Amber Cuevas is a 42 y/o F with PMHx of HTN presenting to the ED with chest pain that has been ongoing for a couple of weeks. Patient reported that the discomfort is localized to the right side of the chest, described as a pressure that gets worse with deep inhalation, and radiates to the right shoulder. Patient reported that the right should has been bothering her for the past 2 weeks, intermittently. Patient reported that this right shoulder discomfort is described as a shooting, aching sensation that gets worse at night. Patient reported that the right shoulder pain became unbearable last night - stated that the pain became constant starting at 7:00PM last night - patient reported that she took ASA at night with minimal relief. Patient reported that she has been taking ASA and Ibuprofen for the discomfort, but is finding no relief. Patient reported that she is concerned. Denied injury, trauma, fever, chills, nausea, vomiting, diarrhea, abdominal pain, constipation, shortness of breathe, difficulty breathing, palpitations, numbness, tingling, drop arm, weakness.   The history is provided by the patient. No language interpreter was used.    Past Medical History  Diagnosis Date  . Hypertension     Past Surgical History  Procedure Laterality Date  . Abdominal hysterectomy    . Appendectomy    . Neck surgery      No family history on file.  History  Substance Use Topics  . Smoking status: Never Smoker   . Smokeless tobacco: Not on file  . Alcohol Use: No    OB History   Grav Para Term Preterm Abortions TAB SAB Ect Mult Living                  Review of Systems  Constitutional:  Negative for fever, chills and fatigue.  HENT: Negative for ear pain, congestion, sore throat, trouble swallowing, neck pain and neck stiffness.   Eyes: Negative for photophobia, pain and visual disturbance.  Respiratory: Negative for cough and chest tightness.   Cardiovascular: Positive for chest pain.  Gastrointestinal: Negative for nausea, vomiting, abdominal pain, diarrhea and constipation.  Genitourinary: Negative for dysuria, decreased urine volume and difficulty urinating.  Musculoskeletal: Positive for arthralgias. Negative for back pain.       Referred right shoulder pain  Neurological: Negative for dizziness, weakness, light-headedness, numbness and headaches.  All other systems reviewed and are negative.    Allergies  Codeine  Home Medications   Current Outpatient Rx  Name  Route  Sig  Dispense  Refill  . estradiol (ESTRACE) 2 MG tablet   Oral   Take 2 mg by mouth daily.         Marland Kitchen HYDROcodone-acetaminophen (NORCO) 5-325 MG per tablet   Oral   Take 1 tablet by mouth every 6 (six) hours as needed for pain.   6 tablet   0   . HYDROcodone-acetaminophen (NORCO/VICODIN) 5-325 MG per tablet   Oral   Take 1 tablet by mouth every 8 (eight) hours as needed for pain.   10 tablet   0   . triamterene-hydrochlorothiazide (DYAZIDE) 37.5-25 MG per capsule   Oral   Take 1 capsule by mouth every morning.  BP 143/100  Pulse 79  Temp(Src) 98.6 F (37 C) (Oral)  Resp 20  Ht 5\' 3"  (1.6 m)  Wt 174 lb (78.926 kg)  BMI 30.83 kg/m2  SpO2 100%  Physical Exam  Nursing note and vitals reviewed. Constitutional: She is oriented to person, place, and time. She appears well-developed and well-nourished. No distress.  HENT:  Head: Normocephalic and atraumatic.  Mouth/Throat: Oropharynx is clear and moist. No oropharyngeal exudate.  Eyes: Conjunctivae and EOM are normal. Pupils are equal, round, and reactive to light. Right eye exhibits no discharge. Left eye exhibits  no discharge.  Neck: Normal range of motion. Neck supple. No tracheal deviation present.  Negative nuchal rigidity Negative neck stiffness  Negative lymphadenopathy  Cardiovascular: Normal rate, regular rhythm and normal heart sounds.  Exam reveals no friction rub.   No murmur heard. Radial pulses 2+ bilaterally  Pulmonary/Chest: Effort normal and breath sounds normal. No respiratory distress. She has no wheezes. She has no rales. She exhibits tenderness. She exhibits no mass, no laceration, no crepitus, no edema, no deformity and no swelling.    Abdominal: Soft. Bowel sounds are normal. She exhibits no distension, no fluid wave, no ascites and no mass. There is no hepatosplenomegaly. There is no tenderness. There is no rigidity, no rebound, no guarding, no tenderness at McBurney's point and negative Murphy's sign. No hernia.  Musculoskeletal: Normal range of motion. She exhibits tenderness. She exhibits no edema.       Right shoulder: She exhibits tenderness. She exhibits normal range of motion, no bony tenderness, no swelling, no effusion, no crepitus, no deformity and no laceration.       Arms: Lymphadenopathy:    She has no cervical adenopathy.  Neurological: She is alert and oriented to person, place, and time. No cranial nerve deficit. She exhibits normal muscle tone. Coordination normal.  Cranial nerves III-XII grossly intact  Skin: Skin is warm and dry. No rash noted. She is not diaphoretic. No erythema.  Psychiatric: She has a normal mood and affect. Her behavior is normal. Thought content normal.    ED Course  Procedures (including critical care time)   Date: 10/26/2012  Rate: 66  Rhythm: normal sinus rhythm  QRS Axis: normal  Intervals: normal  ST/T Wave abnormalities: normal  Conduction Disutrbances:none  Narrative Interpretation:   Old EKG Reviewed: unchanged     Labs Reviewed  COMPREHENSIVE METABOLIC PANEL - Abnormal; Notable for the following:    GFR calc non  Af Amer 77 (*)    GFR calc Af Amer 89 (*)    All other components within normal limits  LIPASE, BLOOD  CBC WITH DIFFERENTIAL  TROPONIN I  CBC WITH DIFFERENTIAL   Dg Chest 2 View  10/26/2012   *RADIOLOGY REPORT*  Clinical Data: Midsternal chest pain.  Shortness of breath. Nonsmoker.  CHEST - 2 VIEW  Comparison: 03/08/2012  Findings: Lateral view degraded by patient arm position.  Midline trachea.  Normal heart size and mediastinal contours. No pleural effusion or pneumothorax.  Clear lungs.  Surgical changes about the right paracentral neck.  IMPRESSION: No acute cardiopulmonary disease.   Original Report Authenticated By: Jeronimo Greaves, M.D.   US Abdomen Complete  10/26/2012   *RADIOLOGY REPORT*  Clinical Data:  Chest pain, right upper quadrant pain  ABDOMINAL ULTRASOUND COMPLETE  Comparison:  04/29/2010, 04/28/2010  Findings:  Gallbladder:  At least two small echogenic shadowing gallstones noted.  Normal wall thickness measuring 2 mm.  No Murphy's sign. No pericholecystic fluid.  Common Bile Duct:  Within normal limits in caliber.  Liver: No focal mass lesion identified.  Within normal limits in parenchymal echogenicity.  IVC:  Appears normal.  Pancreas:  No abnormality identified.  Spleen:  Within normal limits in size and echotexture.  Right kidney:  Normal in size and parenchymal echogenicity.  No evidence of mass or hydronephrosis.  Left kidney:  Normal in size and parenchymal echogenicity.  No evidence of mass or hydronephrosis.  Abdominal Aorta:  No aneurysm identified.  IMPRESSION: Incidental small gallstones.  No other acute finding.   Original Report Authenticated By: Judie Petit. Shick, M.D.     1. Cholelithiasis   2. HTN (hypertension)       MDM  Patient afebrile, non-tachycardic, adequate saturation on room air, alert and oriented.  Mildly elevated blood pressure. Chest xray negative findings CBC negative findings CMP negative findings Lipase within normal limits Troponins negative  findings (<0.30) US Abdomen Complete - Incidental small gallstones noted. No gallbladder wall thickening or pericholecystic fluid noted.  Patient is aseptic, non-toxic appearing, in no acute distress. Negative findings for cholecysitis - no acute infection noted. Discussed case and findings with Dr. Adline Mango - stated to discharge patient. Patient discharged. Discharged with small dose of pain medications. Referred to general surgery to follow-up as outpatient. Referred to follow-up with PCP. Discussed with patient to rest and stay hydrated. Discussed with patient to refrain from eating foods high in fat and cholesterol - can worsen the pain. Discussed with patient to monitor symptoms and if symptoms are to worsen or change to report back to the ED. Patient agreed to plan of care, understood, all questions answered.         Raymon Mutton, PA-C 10/27/12 [phone]

## 2012-10-26 NOTE — Progress Notes (Signed)
Date: 10/26/2012  Rate: 66  Rhythm: normal sinus rhythm  QRS Axis: normal  Intervals: normal  ST/T Wave abnormalities: normal  Conduction Disutrbances:none  Narrative Interpretation: Normal EKG  Old EKG Reviewed: none available

## 2012-10-26 NOTE — ED Notes (Addendum)
C/o intermittent right side chest and back pain x 1week-states she was at work today the pain to chest and back "is just not easing up"-pain is worse when takes a deep breath and ? "knot" on back

## 2012-10-26 NOTE — Progress Notes (Signed)
42 yo woman with 2 week Hx pain in right side of abdomen, right scapular region.  Exam nonrevealing, as was lab workup.  Abdominal ultrasound showed gallstones.  Rx low fat diet, pain medicine, general surgery referral.

## 2012-10-28 ENCOUNTER — Encounter (HOSPITAL_COMMUNITY): Payer: Self-pay | Admitting: Emergency Medicine

## 2012-10-28 ENCOUNTER — Emergency Department (HOSPITAL_COMMUNITY)
Admission: EM | Admit: 2012-10-28 | Discharge: 2012-10-29 | Disposition: A | Discharge status: Home / Self Care | Payer: Managed Care, Other (non HMO) | Attending: Emergency Medicine | Admitting: Emergency Medicine

## 2012-10-28 DIAGNOSIS — R112 Nausea with vomiting, unspecified: Secondary | ICD-10-CM | POA: Insufficient documentation

## 2012-10-28 DIAGNOSIS — K8020 Calculus of gallbladder without cholecystitis without obstruction: Secondary | ICD-10-CM

## 2012-10-28 DIAGNOSIS — I10 Essential (primary) hypertension: Secondary | ICD-10-CM | POA: Insufficient documentation

## 2012-10-28 DIAGNOSIS — Z885 Allergy status to narcotic agent status: Secondary | ICD-10-CM | POA: Insufficient documentation

## 2012-10-28 DIAGNOSIS — Z79899 Other long term (current) drug therapy: Secondary | ICD-10-CM | POA: Insufficient documentation

## 2012-10-28 LAB — CBC WITH DIFFERENTIAL/PLATELET
HCT: 38.4 % (ref 36.0–46.0)
Hemoglobin: 13.2 g/dL (ref 12.0–15.0)
Lymphocytes Relative: 35 % (ref 12–46)
Monocytes Absolute: 0.5 10*3/uL (ref 0.1–1.0)
Monocytes Relative: 7 % (ref 3–12)
Neutro Abs: 4 10*3/uL (ref 1.7–7.7)
Neutrophils Relative %: 57 % (ref 43–77)
RBC: 4.63 MIL/uL (ref 3.87–5.11)
WBC: 7 10*3/uL (ref 4.0–10.5)

## 2012-10-28 LAB — COMPREHENSIVE METABOLIC PANEL
AST: 20 U/L (ref 0–37)
Albumin: 4.1 g/dL (ref 3.5–5.2)
Alkaline Phosphatase: 67 U/L (ref 39–117)
BUN: 14 mg/dL (ref 6–23)
CO2: 24 mEq/L (ref 19–32)
Chloride: 106 mEq/L (ref 96–112)
Creatinine, Ser: 0.9 mg/dL (ref 0.50–1.10)
GFR calc non Af Amer: 77 mL/min — ABNORMAL LOW (ref >90)
Potassium: 4 mEq/L (ref 3.5–5.1)
Total Bilirubin: 0.3 mg/dL (ref 0.3–1.2)

## 2012-10-28 LAB — POCT I-STAT TROPONIN I: Troponin i, poc: 0 ng/mL (ref 0.00–0.08)

## 2012-10-28 LAB — LIPASE, BLOOD: Lipase: 20 U/L (ref 11–59)

## 2012-10-28 MED ORDER — HYDROMORPHONE HCL PF 1 MG/ML IJ SOLN
1.0000 mg | Freq: Once | INTRAMUSCULAR | Status: AC
  Administered 2012-10-28: 1 mg via INTRAVENOUS
  Filled 2012-10-28: qty 1

## 2012-10-28 MED ORDER — ONDANSETRON HCL 4 MG/2ML IJ SOLN
4.0000 mg | Freq: Once | INTRAMUSCULAR | Status: AC
  Administered 2012-10-28: 4 mg via INTRAVENOUS
  Filled 2012-10-28: qty 2

## 2012-10-28 NOTE — ED Notes (Signed)
C/o intermittent R sided chest pain that radiates to R side of back x 3 weeks.  Seen at [facility] on 5/16 and diagnosed with cholelithiasis.  Pt is suppose to call in morning for appt with [facility].  Taking Vicodin without relief.

## 2012-10-28 NOTE — ED Provider Notes (Signed)
History     CSN: [id number]  Arrival date & time 10/28/12  2208   First MD Initiated Contact with Patient 10/28/12 2221      Chief Complaint  Patient presents with  . Cholelithiasis    (Consider location/radiation/quality/duration/timing/severity/associated sxs/prior treatment) HPI Comments: Patient presents with a chief complaint of right sided chest pain and RUQ abdominal pain.  Symptoms have been present intermittently over the past month, but has been occurring more frequently over the past 2-3 days.  She reports that the pain today is the same pain that she has had in the past.  Pain radiates to her right shoulder.  She reports that the pain became worse after eating.  She has been nauseous and had one episode of vomiting yesterday and one episode of vomiting today.  She was seen in the ED two days ago and had an Abdominal Ultrasound that showed Cholelithiasis, but no Cholecystitis.  She was discharged home with Hydrocodone, but reports that she has not been taking it.  She does not like the way that the medication makes her feel and is therefore taking Ibuprofen for the pain, which gives her mild relief.  She denies SOB, cough, fever, or chills.  Denies diarrhea.    The history is provided by the patient.    Past Medical History  Diagnosis Date  . Hypertension     Past Surgical History  Procedure Laterality Date  . Abdominal hysterectomy    . Appendectomy    . Neck surgery      No family history on file.  History  Substance Use Topics  . Smoking status: Never Smoker   . Smokeless tobacco: Not on file  . Alcohol Use: No    OB History   Grav Para Term Preterm Abortions TAB SAB Ect Mult Living                  Review of Systems  Constitutional: Negative for fever and chills.  Gastrointestinal: Positive for nausea, vomiting and abdominal pain.  All other systems reviewed and are negative.    Allergies  Codeine  Home Medications   Current Outpatient Rx   Name  Route  Sig  Dispense  Refill  . estradiol (ESTRACE) 2 MG tablet   Oral   Take 2 mg by mouth daily.         Marland Kitchen HYDROcodone-acetaminophen (NORCO/VICODIN) 5-325 MG per tablet   Oral   Take 1 tablet by mouth every 6 (six) hours as needed for pain.         Marland Kitchen ibuprofen (ADVIL,MOTRIN) 200 MG tablet   Oral   Take 800 mg by mouth every 6 (six) hours as needed for pain.           BP 131/81  Pulse 76  Temp(Src) 97.6 F (36.4 C) (Oral)  Resp 15  SpO2 99%  Physical Exam  Nursing note and vitals reviewed. Constitutional: She appears well-developed and well-nourished.  HENT:  Head: Normocephalic and atraumatic.  Mouth/Throat: Oropharynx is clear and moist.  Neck: Normal range of motion. Neck supple.  Cardiovascular: Normal rate, regular rhythm and normal heart sounds.   Pulmonary/Chest: Effort normal and breath sounds normal. No respiratory distress. She has no wheezes. She has no rales. She exhibits tenderness.  Abdominal: Soft. Normal appearance and bowel sounds are normal. She exhibits no distension. There is tenderness in the right upper quadrant. There is no rebound and negative Murphy's sign.  Neurological: She is alert.  Skin: Skin is  warm and dry.  Psychiatric: She has a normal mood and affect.    ED Course  Procedures (including critical care time)  Labs Reviewed  CBC WITH DIFFERENTIAL  COMPREHENSIVE METABOLIC PANEL  LIPASE, BLOOD  POCT I-STAT TROPONIN I   No results found.   No diagnosis found.    MDM  Patient presenting with RUQ abdominal pain.  Seen here 2 days ago for the same.  Ultrasound 2 days ago showed Cholelithiasis, but no signs of Cholecystitis.  Labs today unremarkable.  Therefore, feel that patient is stable for discharge.  Patient in agreement with the plan.  Patient given referral to GI and given prescription for tramadol.          Pascal Lux [place], PA-C 10/29/12 2154

## 2012-10-29 ENCOUNTER — Telehealth (INDEPENDENT_AMBULATORY_CARE_PROVIDER_SITE_OTHER): Payer: Self-pay

## 2012-10-29 MED ORDER — TRAMADOL HCL 50 MG PO TABS: 50.0000 mg | ORAL_TABLET | Freq: Four times a day (QID) | ORAL | Status: DC | PRN

## 2012-10-29 NOTE — Telephone Encounter (Signed)
Patient ask to be sooner due to pain. Advised her to go to the ER if she was having extreme we do not have an opening for today. She has appt with Dr Derrell Lolling 10/30/12 @ 945am

## 2012-10-29 NOTE — ED Notes (Signed)
The patient is AOx4 and comfortable with her discharge instructions.  Her significant other is driving her home. 

## 2012-10-30 ENCOUNTER — Ambulatory Visit (INDEPENDENT_AMBULATORY_CARE_PROVIDER_SITE_OTHER): Payer: 59 | Admitting: General Surgery

## 2012-10-30 ENCOUNTER — Encounter (INDEPENDENT_AMBULATORY_CARE_PROVIDER_SITE_OTHER): Payer: Self-pay | Admitting: General Surgery

## 2012-10-30 VITALS — BP 118/82 | HR 72 | Temp 98.1°F | Resp 18 | Ht 63.0 in | Wt 176.0 lb

## 2012-10-30 DIAGNOSIS — K8020 Calculus of gallbladder without cholecystitis without obstruction: Secondary | ICD-10-CM | POA: Insufficient documentation

## 2012-10-30 NOTE — ED Provider Notes (Signed)
Medical screening examination/treatment/procedure(s) were conducted as a shared visit with non-physician practitioner(s) and myself.  I personally evaluated the patient during the encounter 42 yo woman with 2 week Hx pain in right side of abdomen, right scapular region. Exam nonrevealing, as was lab workup. Abdominal ultrasound showed gallstones. Rx low fat diet, pain medicine, general surgery referral.       Carleene Cooper III, MD 10/30/12 1434

## 2012-10-30 NOTE — Progress Notes (Signed)
Patient ID: Amber Cuevas, female   DOB: 07-Apr-1971, 42 y.o.   MRN: [id number]  Chief Complaint  Patient presents with  . Cholelithiasis    HPI Amber Cuevas is a 42 y.o. female.  She is referred by Dr. Hipolito Bayley in the emergency department for management of symptomatic gallstones. Her primary care physician is Dr. Evaristo Bury be full.  The patient gives a one-month history of intermittent episodes of right upper quadrant postprandial pain. This became worse and became a daily problem last week. She's been to the emergency room twice. All of her lab work is normal. Her ultrasound showed small gallstones but no other acute finding. Her chest x-ray is normal. She had transient diarrhea but this is now resolved. She denies fever or chills. Denies jaundice.  Past history significant for laparoscopic robotic hysterectomy and BSO by Dr. Henderson Cloud. Open appendectomy. Cervical disc surgery. Right carpal tunnel surgery.  She states that she is having daily pain. She does not appear to be in any distress today.  HPI  Past Medical History  Diagnosis Date  . Hypertension     Past Surgical History  Procedure Laterality Date  . Abdominal hysterectomy    . Appendectomy    . Neck surgery    . Hand surgery      No family history on file.  Social History History  Substance Use Topics  . Smoking status: Never Smoker   . Smokeless tobacco: Not on file  . Alcohol Use: No    Allergies  Allergen Reactions  . Codeine     Rash and itching    Current Outpatient Prescriptions  Medication Sig Dispense Refill  . estradiol (ESTRACE) 2 MG tablet Take 2 mg by mouth daily.      Marland Kitchen ibuprofen (ADVIL,MOTRIN) 200 MG tablet Take 800 mg by mouth every 6 (six) hours as needed for pain.       No current facility-administered medications for this visit.    Review of Systems Review of Systems  Constitutional: Negative for fever, chills and unexpected weight change.  HENT: Negative for hearing loss, congestion,  sore throat, trouble swallowing and voice change.   Eyes: Negative for visual disturbance.  Respiratory: Negative for cough and wheezing.   Cardiovascular: Negative for chest pain, palpitations and leg swelling.  Gastrointestinal: Positive for nausea, vomiting, abdominal pain and diarrhea. Negative for constipation, blood in stool, abdominal distention and anal bleeding.  Genitourinary: Negative for hematuria, vaginal bleeding and difficulty urinating.  Musculoskeletal: Negative for arthralgias.  Skin: Negative for rash and wound.  Neurological: Negative for seizures, syncope and headaches.  Hematological: Negative for adenopathy. Does not bruise/bleed easily.  Psychiatric/Behavioral: Negative for confusion.    Blood pressure 118/82, pulse 72, temperature 98.1 F (36.7 C), temperature source Oral, resp. rate 18, height 5\' 3"  (1.6 m), weight 176 lb (79.833 kg).  Physical Exam Physical Exam  Constitutional: She is oriented to person, place, and time. She appears well-developed and well-nourished. No distress.  HENT:  Head: Normocephalic and atraumatic.  Nose: Nose normal.  Mouth/Throat: No oropharyngeal exudate.  Eyes: Conjunctivae and EOM are normal. Pupils are equal, round, and reactive to light. Left eye exhibits no discharge. No scleral icterus.  Neck: Neck supple. No JVD present. No tracheal deviation present. No thyromegaly present.  Cardiovascular: Normal rate, regular rhythm, normal heart sounds and intact distal pulses.   No murmur heard. Pulmonary/Chest: Effort normal and breath sounds normal. No respiratory distress. She has no wheezes. She has no rales. She  exhibits no tenderness.  Tattoo above left breast  Abdominal: Soft. Bowel sounds are normal. She exhibits no distension and no mass. There is no tenderness. There is no rebound and no guarding.  Her abdomen is actually generally soft without guarding mass or distention. Transverse right lower quadrant scar. Multiple trocar  sites from hysterectomy. No hernias noted.  Tattooed circumferentially around the umbilicus.  Musculoskeletal: She exhibits no edema and no tenderness.  Lymphadenopathy:    She has no cervical adenopathy.  Neurological: She is alert and oriented to person, place, and time. She exhibits normal muscle tone. Coordination normal.  Skin: Skin is warm. No rash noted. She is not diaphoretic. No erythema. No pallor.  Psychiatric: She has a normal mood and affect. Her behavior is normal. Judgment and thought content normal.    Data Reviewed Emergency department physician notes, a lab work, chest x-ray, ultrasound  Assessment    Chronic cholecystitis with cholelithiasis. Now with accelerating biliary colic.  History robotic TAH and BSO  History of open appendectomy  History cervical disc surgery.     Plan    Schedule for laparoscopic cholecystectomy with cholangiogram, possible open.  Low-fat diet and Vicodin in the interim  I discussed the indications, details, techniques and numerous risks of the surgery with her. We discussed the risk of bleeding, infection, bile leak, conversion to open laparotomy, injury to adjacent organs, cardiac pulmonary and thromboembolic problems. Unforeseen complications. She understands all these issues and all of her questions are answered. She agrees with this plan.        Angelia Mould. Derrell Lolling, M.D., [facility] General and Minimally invasive Surgery Breast and Colorectal Surgery Office:   [phone] Pager:   [phone]  10/30/2012, 10:35 AM

## 2012-10-30 NOTE — ED Provider Notes (Signed)
Medical screening examination/treatment/procedure(s) were performed by non-physician practitioner and as supervising physician I was immediately available for consultation/collaboration.   Carleene Cooper III, MD 10/30/12 [phone]

## 2012-10-30 NOTE — Patient Instructions (Signed)
You have gallstones. It is very likely that the gallbladder is causing you episodes of upper abdominal pain nausea and vomiting.  You will be scheduled for laparoscopic cholecystectomy with cholangiogram, possible open.      Laparoscopic Cholecystectomy Laparoscopic cholecystectomy is surgery to remove the gallbladder. The gallbladder is located slightly to the right of center in the abdomen, behind the liver. It is a concentrating and storage sac for the bile produced in the liver. Bile aids in the digestion and absorption of fats. Gallbladder disease (cholecystitis) is an inflammation of your gallbladder. This condition is usually caused by a buildup of gallstones (cholelithiasis) in your gallbladder. Gallstones can block the flow of bile, resulting in inflammation and pain. In severe cases, emergency surgery may be required. When emergency surgery is not required, you will have time to prepare for the procedure. Laparoscopic surgery is an alternative to open surgery. Laparoscopic surgery usually has a shorter recovery time. Your common bile duct may also need to be examined and explored. Your caregiver will discuss this with you if he or she feels this should be done. If stones are found in the common bile duct, they may be removed. LET YOUR CAREGIVER KNOW ABOUT:  Allergies to food or medicine.  Medicines taken, including vitamins, herbs, eyedrops, over-the-counter medicines, and creams.  Use of steroids (by mouth or creams).  Previous problems with anesthetics or numbing medicines.  History of bleeding problems or blood clots.  Previous surgery.  Other health problems, including diabetes and kidney problems.  Possibility of pregnancy, if this applies. RISKS AND COMPLICATIONS All surgery is associated with risks. Some problems that may occur following this procedure include:  Infection.  Damage to the common bile duct, nerves, arteries, veins, or other internal organs such as the  stomach or intestines.  Bleeding.  A stone may remain in the common bile duct. BEFORE THE PROCEDURE  Do not take aspirin for 3 days prior to surgery or blood thinners for 1 week prior to surgery.  Do not eat or drink anything after midnight the night before surgery.  Let your caregiver know if you develop a cold or other infectious problem prior to surgery.  You should be present 60 minutes before the procedure or as directed. PROCEDURE  You will be given medicine that makes you sleep (general anesthetic). When you are asleep, your surgeon will make several small cuts (incisions) in your abdomen. One of these incisions is used to insert a small, lighted scope (laparoscope) into the abdomen. The laparoscope helps the surgeon see into your abdomen. Carbon dioxide gas will be pumped into your abdomen. The gas allows more room for the surgeon to perform your surgery. Other operating instruments are inserted through the other incisions. Laparoscopic procedures may not be appropriate when:  There is major scarring from previous surgery.  The gallbladder is extremely inflamed.  There are bleeding disorders or unexpected cirrhosis of the liver.  A pregnancy is near term.  Other conditions make the laparoscopic procedure impossible. If your surgeon feels it is not safe to continue with a laparoscopic procedure, he or she will perform an open abdominal procedure. In this case, the surgeon will make an incision to open the abdomen. This gives the surgeon a larger view and field to work within. This may allow the surgeon to perform procedures that sometimes cannot be performed with a laparoscope alone. Open surgery has a longer recovery time. AFTER THE PROCEDURE  You will be taken to the recovery area where  a nurse will watch and check your progress.  You may be allowed to go home the same day.  Do not resume physical activities until directed by your caregiver.  You may resume a normal diet  and activities as directed. Document Released: 05/30/2005 Document Revised: 08/22/2011 Document Reviewed: 11/12/2010 [facility] Patient Information 2013 [place], [place].

## 2012-11-06 ENCOUNTER — Ambulatory Visit (INDEPENDENT_AMBULATORY_CARE_PROVIDER_SITE_OTHER): Payer: 59 | Admitting: Surgery

## 2012-11-14 ENCOUNTER — Encounter (HOSPITAL_COMMUNITY): Payer: Self-pay | Admitting: Pharmacist

## 2012-11-15 NOTE — Pre-Procedure Instructions (Signed)
Amber Cuevas  11/15/2012   Your procedure is scheduled on:  Tuesday November 27, 2012  Report to [facility] at 1330 PM.  Call this number if you have problems the morning of surgery: [phone]   Remember:   Do not eat food or drink liquids after midnight Monday   Take these medicines the morning of surgery with A SIP OF WATER: None   Do not wear jewelry, make-up or nail polish.  Do not wear lotions, powders, or perfumes. You may wear deodorant.  Do not shave 48 hours prior to surgery.   Do not bring valuables to the hospital.  [facility] is not responsible                   for any belongings or valuables.  Contacts, dentures or bridgework may not be worn into surgery.  Leave suitcase in the car. After surgery it may be brought to your room.  For patients admitted to the hospital, checkout time is 11:00 AM the day of  discharge.   Patients discharged the day of surgery will not be allowed to drive  home.    Special Instructions: Shower using CHG 2 nights before surgery and the night before surgery.  If you shower the day of surgery use CHG.  Use special wash - you have one bottle of CHG for all showers.  You should use approximately 1/3 of the bottle for each shower.   Please read over the following fact sheets that you were given: Pain Booklet, Coughing and Deep Breathing, MRSA Information and Surgical Site Infection Prevention

## 2012-11-16 ENCOUNTER — Encounter (HOSPITAL_COMMUNITY)
Admission: RE | Admit: 2012-11-16 | Discharge: 2012-11-16 | Disposition: A | Discharge status: Home / Self Care | Payer: Managed Care, Other (non HMO) | Source: Ambulatory Visit | Attending: General Surgery | Admitting: General Surgery

## 2012-11-16 ENCOUNTER — Encounter (HOSPITAL_COMMUNITY): Payer: Self-pay

## 2012-11-16 LAB — CBC
HCT: 39.5 % (ref 36.0–46.0)
Hemoglobin: 13.3 g/dL (ref 12.0–15.0)
MCH: 28.4 pg (ref 26.0–34.0)
MCHC: 33.7 g/dL (ref 30.0–36.0)

## 2012-11-16 LAB — BASIC METABOLIC PANEL
BUN: 10 mg/dL (ref 6–23)
Chloride: 106 mEq/L (ref 96–112)
GFR calc Af Amer: >90 mL/min (ref >90)
Glucose, Bld: 93 mg/dL (ref 70–99)
Potassium: 3.7 mEq/L (ref 3.5–5.1)

## 2012-11-16 MED ORDER — DIAZEPAM 5 MG PO TABS
ORAL_TABLET | ORAL | Status: AC
  Filled 2012-11-16: qty 1

## 2012-11-16 MED ORDER — ASPIRIN 81 MG PO CHEW
CHEWABLE_TABLET | ORAL | Status: AC
  Filled 2012-11-16: qty 4

## 2012-11-21 ENCOUNTER — Encounter (HOSPITAL_BASED_OUTPATIENT_CLINIC_OR_DEPARTMENT_OTHER): Payer: Self-pay | Admitting: Emergency Medicine

## 2012-11-21 ENCOUNTER — Inpatient Hospital Stay (HOSPITAL_BASED_OUTPATIENT_CLINIC_OR_DEPARTMENT_OTHER)
Admission: EM | Admit: 2012-11-21 | Discharge: 2012-11-27 | DRG: 418 | Disposition: A | Discharge status: Home / Self Care | Payer: Managed Care, Other (non HMO) | Attending: General Surgery | Admitting: General Surgery

## 2012-11-21 DIAGNOSIS — I10 Essential (primary) hypertension: Secondary | ICD-10-CM | POA: Diagnosis present

## 2012-11-21 DIAGNOSIS — E669 Obesity, unspecified: Secondary | ICD-10-CM | POA: Diagnosis present

## 2012-11-21 DIAGNOSIS — K8010 Calculus of gallbladder with chronic cholecystitis without obstruction: Principal | ICD-10-CM | POA: Diagnosis present

## 2012-11-21 DIAGNOSIS — K560 Paralytic ileus: Secondary | ICD-10-CM | POA: Diagnosis not present

## 2012-11-21 DIAGNOSIS — Y836 Removal of other organ (partial) (total) as the cause of abnormal reaction of the patient, or of later complication, without mention of misadventure at the time of the procedure: Secondary | ICD-10-CM | POA: Diagnosis present

## 2012-11-21 DIAGNOSIS — Z9089 Acquired absence of other organs: Secondary | ICD-10-CM

## 2012-11-21 DIAGNOSIS — K929 Disease of digestive system, unspecified: Secondary | ICD-10-CM | POA: Diagnosis not present

## 2012-11-21 DIAGNOSIS — R079 Chest pain, unspecified: Secondary | ICD-10-CM | POA: Diagnosis present

## 2012-11-21 DIAGNOSIS — K8020 Calculus of gallbladder without cholecystitis without obstruction: Secondary | ICD-10-CM

## 2012-11-21 DIAGNOSIS — M549 Dorsalgia, unspecified: Secondary | ICD-10-CM | POA: Diagnosis present

## 2012-11-21 LAB — COMPREHENSIVE METABOLIC PANEL
BUN: 13 mg/dL (ref 6–23)
Calcium: 10.2 mg/dL (ref 8.4–10.5)
Creatinine, Ser: 0.9 mg/dL (ref 0.50–1.10)
GFR calc Af Amer: 89 mL/min — ABNORMAL LOW (ref >90)
Glucose, Bld: 92 mg/dL (ref 70–99)
Sodium: 139 mEq/L (ref 135–145)
Total Protein: 8.2 g/dL (ref 6.0–8.3)

## 2012-11-21 LAB — LIPASE, BLOOD: Lipase: 21 U/L (ref 11–59)

## 2012-11-21 LAB — CBC WITH DIFFERENTIAL/PLATELET
Eosinophils Absolute: 0.1 10*3/uL (ref 0.0–0.7)
Eosinophils Relative: 1 % (ref 0–5)
Lymphs Abs: 2.7 10*3/uL (ref 0.7–4.0)
MCH: 28.6 pg (ref 26.0–34.0)
MCV: 83.2 fL (ref 78.0–100.0)
Monocytes Absolute: 0.6 10*3/uL (ref 0.1–1.0)
Monocytes Relative: 7 % (ref 3–12)
Platelets: 273 10*3/uL (ref 150–400)
RBC: 4.83 MIL/uL (ref 3.87–5.11)

## 2012-11-21 MED ORDER — HYDROMORPHONE HCL PF 1 MG/ML IJ SOLN
1.0000 mg | Freq: Once | INTRAMUSCULAR | Status: AC
  Administered 2012-11-21: 1 mg via INTRAVENOUS
  Filled 2012-11-21: qty 1

## 2012-11-21 MED ORDER — ONDANSETRON HCL 4 MG/2ML IJ SOLN
4.0000 mg | Freq: Once | INTRAMUSCULAR | Status: AC
  Administered 2012-11-21: 4 mg via INTRAVENOUS
  Filled 2012-11-21: qty 2

## 2012-11-21 MED ORDER — PROMETHAZINE HCL 25 MG/ML IJ SOLN
25.0000 mg | Freq: Once | INTRAMUSCULAR | Status: AC
  Administered 2012-11-21: 25 mg via INTRAVENOUS
  Filled 2012-11-21: qty 1

## 2012-11-21 MED ORDER — SODIUM CHLORIDE 0.9 % IV SOLN
INTRAVENOUS | Status: AC
  Administered 2012-11-21: 22:00:00 via INTRAVENOUS

## 2012-11-21 NOTE — ED Notes (Signed)
Pt has gall stone surgery scheduled in 6 days. Pain intolerable today.  Vomiting x3 days.

## 2012-11-21 NOTE — ED Provider Notes (Signed)
History     CSN: [id number]  Arrival date & time 11/21/12  2010   First MD Initiated Contact with Patient 11/21/12 2048      Chief Complaint  Patient presents with  . Back Pain  . Chest Pain    (Consider location/radiation/quality/duration/timing/severity/associated sxs/prior treatment) Patient is a 42 y.o. female presenting with abdominal pain. The history is provided by the patient.  Abdominal Pain This is a recurrent problem. Episode onset: intermittent abd pain for the last week but last 3 days persistent pain worse tonight with intermittent vomiting.  gallbadder surgery scheduled for tues. The problem occurs daily. The problem has been rapidly worsening. Associated symptoms include abdominal pain. Pertinent negatives include no shortness of breath. Associated symptoms comments: Radiation from the RUQ to the right shoulder. Nothing aggravates the symptoms. Nothing relieves the symptoms. The treatment provided no relief.    Past Medical History  Diagnosis Date  . Hypertension     Past Surgical History  Procedure Laterality Date  . Abdominal hysterectomy    . Appendectomy    . Neck surgery    . Hand surgery      No family history on file.  History  Substance Use Topics  . Smoking status: Never Smoker   . Smokeless tobacco: Never Used  . Alcohol Use: No    OB History   Grav Para Term Preterm Abortions TAB SAB Ect Mult Living                  Review of Systems  Constitutional: Negative for fever.  Respiratory: Negative for shortness of breath.   Gastrointestinal: Positive for nausea, vomiting and abdominal pain. Negative for diarrhea.  All other systems reviewed and are negative.    Allergies  Codeine  Home Medications   Current Outpatient Rx  Name  Route  Sig  Dispense  Refill  . estradiol (ESTRACE) 2 MG tablet   Oral   Take 2 mg by mouth daily.         Marland Kitchen GARLIC PO   Oral   Take 1 tablet by mouth daily as needed (blood pressure).         Marland Kitchen  ibuprofen (ADVIL,MOTRIN) 200 MG tablet   Oral   Take 400-800 mg by mouth every 6 (six) hours as needed for pain.            BP 170/98  Pulse 87  Temp(Src) 98.1 F (36.7 C) (Oral)  Resp 20  Ht 5\' 3"  (1.6 m)  Wt 173 lb (78.472 kg)  BMI 30.65 kg/m2  SpO2 98%  Physical Exam  Nursing note and vitals reviewed. Constitutional: She is oriented to person, place, and time. She appears well-developed and well-nourished. She appears distressed.  HENT:  Head: Normocephalic and atraumatic.  Mouth/Throat: Oropharynx is clear and moist.  Eyes: Conjunctivae and EOM are normal. Pupils are equal, round, and reactive to light.  Neck: Normal range of motion. Neck supple.  Cardiovascular: Normal rate, regular rhythm and intact distal pulses.   No murmur heard. Pulmonary/Chest: Effort normal and breath sounds normal. No respiratory distress. She has no wheezes. She has no rales.  Abdominal: Soft. She exhibits no distension. There is tenderness in the right upper quadrant. There is positive Murphy's sign. There is no rebound, no guarding and no CVA tenderness.  Well healed appendectomy scar  Musculoskeletal: Normal range of motion. She exhibits no edema and no tenderness.  Neurological: She is alert and oriented to person, place, and time.  Skin:  Skin is warm and dry. No rash noted. No erythema.  Psychiatric: She has a normal mood and affect. Her behavior is normal.    ED Course  Procedures (including critical care time)  Labs Reviewed  COMPREHENSIVE METABOLIC PANEL - Abnormal; Notable for the following:    GFR calc non Af Amer 77 (*)    GFR calc Af Amer 89 (*)    All other components within normal limits  CBC WITH DIFFERENTIAL  LIPASE, BLOOD   No results found.   1. Cholelithiasis       MDM   Patient with a history of cholelithiasis who had been seen in mid May and in found to have small gallstones without any complicating features. She'd seen Dr. Derrell Lolling and has scheduled surgery  for cholecystectomy on Tuesday however over the last few days she's had persistent pain with vomiting intermittently and now the pain is severe and not resolving. She been taking Advil at home as needed for the pain that is not improving her symptoms. She denies any fever or diarrhea. On exam she is extremely uncomfortable with improvement after Dilaudid but still no resolution of the pain. Positive Murphy sign but negative labs. We'll discuss with surgery.         Gwyneth Sprout, MD 11/21/12 2147

## 2012-11-22 ENCOUNTER — Encounter (HOSPITAL_COMMUNITY): Payer: Self-pay | Admitting: Certified Registered Nurse Anesthetist

## 2012-11-22 ENCOUNTER — Inpatient Hospital Stay (HOSPITAL_COMMUNITY): Payer: Managed Care, Other (non HMO)

## 2012-11-22 ENCOUNTER — Encounter (HOSPITAL_COMMUNITY): Admission: EM | Disposition: A | Discharge status: Home / Self Care | Payer: Self-pay | Source: Home / Self Care

## 2012-11-22 ENCOUNTER — Inpatient Hospital Stay (HOSPITAL_COMMUNITY): Payer: Managed Care, Other (non HMO) | Admitting: Certified Registered Nurse Anesthetist

## 2012-11-22 DIAGNOSIS — K8010 Calculus of gallbladder with chronic cholecystitis without obstruction: Secondary | ICD-10-CM

## 2012-11-22 HISTORY — PX: CHOLECYSTECTOMY: SHX55

## 2012-11-22 SURGERY — LAPAROSCOPIC CHOLECYSTECTOMY WITH INTRAOPERATIVE CHOLANGIOGRAM
Anesthesia: General | Wound class: Clean Contaminated

## 2012-11-22 MED ORDER — PROPOFOL 10 MG/ML IV BOLUS
INTRAVENOUS | Status: DC | PRN
  Administered 2012-11-22: 150 mg via INTRAVENOUS

## 2012-11-22 MED ORDER — HYDROCODONE-ACETAMINOPHEN 5-325 MG PO TABS
1.0000 | ORAL_TABLET | ORAL | Status: DC | PRN
  Administered 2012-11-22: 1 via ORAL
  Administered 2012-11-23 – 2012-11-27 (×3): 2 via ORAL
  Filled 2012-11-22: qty 2
  Filled 2012-11-22: qty 1
  Filled 2012-11-22 (×3): qty 2

## 2012-11-22 MED ORDER — ONDANSETRON HCL 4 MG/2ML IJ SOLN
INTRAMUSCULAR | Status: DC | PRN
  Administered 2012-11-22: 4 mg via INTRAVENOUS

## 2012-11-22 MED ORDER — LIDOCAINE HCL (CARDIAC) 20 MG/ML IV SOLN
INTRAVENOUS | Status: DC | PRN
  Administered 2012-11-22: 100 mg via INTRAVENOUS

## 2012-11-22 MED ORDER — ROCURONIUM BROMIDE 100 MG/10ML IV SOLN
INTRAVENOUS | Status: DC | PRN
  Administered 2012-11-22: 35 mg via INTRAVENOUS

## 2012-11-22 MED ORDER — FENTANYL CITRATE 0.05 MG/ML IJ SOLN
25.0000 ug | INTRAMUSCULAR | Status: DC | PRN
  Administered 2012-11-22: 50 ug via INTRAVENOUS

## 2012-11-22 MED ORDER — SODIUM CHLORIDE 0.9 % IJ SOLN
INTRAMUSCULAR | Status: DC | PRN
  Administered 2012-11-22: 11:00:00

## 2012-11-22 MED ORDER — SUCCINYLCHOLINE CHLORIDE 20 MG/ML IJ SOLN
INTRAMUSCULAR | Status: DC | PRN
  Administered 2012-11-22: 100 mg via INTRAVENOUS

## 2012-11-22 MED ORDER — GLYCOPYRROLATE 0.2 MG/ML IJ SOLN
INTRAMUSCULAR | Status: DC | PRN
  Administered 2012-11-22: 0.6 mg via INTRAVENOUS
  Administered 2012-11-22: 0.2 mg via INTRAVENOUS

## 2012-11-22 MED ORDER — NEOSTIGMINE METHYLSULFATE 1 MG/ML IJ SOLN
INTRAMUSCULAR | Status: DC | PRN
  Administered 2012-11-22: 5 mg via INTRAVENOUS

## 2012-11-22 MED ORDER — LACTATED RINGERS IV SOLN: INTRAVENOUS | Status: DC

## 2012-11-22 MED ORDER — LACTATED RINGERS IV SOLN
INTRAVENOUS | Status: DC
  Administered 2012-11-22: 1000 mL via INTRAVENOUS

## 2012-11-22 MED ORDER — PHENYLEPHRINE HCL 10 MG/ML IJ SOLN
INTRAMUSCULAR | Status: DC | PRN
  Administered 2012-11-22 (×3): 80 ug via INTRAVENOUS

## 2012-11-22 MED ORDER — KCL IN DEXTROSE-NACL 20-5-0.9 MEQ/L-%-% IV SOLN
INTRAVENOUS | Status: DC
  Administered 2012-11-22 – 2012-11-24 (×5): via INTRAVENOUS
  Administered 2012-11-25: 100 mL/h via INTRAVENOUS
  Administered 2012-11-25 – 2012-11-26 (×4): via INTRAVENOUS
  Filled 2012-11-22 (×16): qty 1000

## 2012-11-22 MED ORDER — ONDANSETRON HCL 4 MG/2ML IJ SOLN
4.0000 mg | Freq: Four times a day (QID) | INTRAMUSCULAR | Status: DC | PRN
  Administered 2012-11-22 – 2012-11-25 (×5): 4 mg via INTRAVENOUS
  Filled 2012-11-22 (×5): qty 2

## 2012-11-22 MED ORDER — MORPHINE SULFATE 2 MG/ML IJ SOLN
2.0000 mg | INTRAMUSCULAR | Status: DC | PRN
  Administered 2012-11-22 (×5): 2 mg via INTRAVENOUS
  Administered 2012-11-23 (×2): 4 mg via INTRAVENOUS
  Administered 2012-11-23: 2 mg via INTRAVENOUS
  Administered 2012-11-23 – 2012-11-24 (×6): 4 mg via INTRAVENOUS
  Administered 2012-11-24: 2 mg via INTRAVENOUS
  Administered 2012-11-24 – 2012-11-25 (×5): 4 mg via INTRAVENOUS
  Administered 2012-11-26: 2 mg via INTRAVENOUS
  Filled 2012-11-22 (×4): qty 2
  Filled 2012-11-22 (×2): qty 1
  Filled 2012-11-22: qty 2
  Filled 2012-11-22: qty 4
  Filled 2012-11-22: qty 1
  Filled 2012-11-22 (×6): qty 2
  Filled 2012-11-22: qty 1
  Filled 2012-11-22: qty 2
  Filled 2012-11-22 (×2): qty 1
  Filled 2012-11-22 (×2): qty 2

## 2012-11-22 MED ORDER — SODIUM CHLORIDE 0.9 % IV SOLN
1.0000 g | Freq: Every day | INTRAVENOUS | Status: DC
  Administered 2012-11-22 – 2012-11-26 (×6): 1 g via INTRAVENOUS
  Filled 2012-11-22 (×7): qty 1

## 2012-11-22 MED ORDER — FENTANYL CITRATE 0.05 MG/ML IJ SOLN
INTRAMUSCULAR | Status: DC | PRN
  Administered 2012-11-22: 50 ug via INTRAVENOUS
  Administered 2012-11-22: 100 ug via INTRAVENOUS

## 2012-11-22 MED ORDER — MEPERIDINE HCL 50 MG/ML IJ SOLN: 6.2500 mg | INTRAMUSCULAR | Status: DC | PRN

## 2012-11-22 MED ORDER — HEPARIN SODIUM (PORCINE) 5000 UNIT/ML IJ SOLN
5000.0000 [IU] | Freq: Three times a day (TID) | INTRAMUSCULAR | Status: DC
  Administered 2012-11-22: 5000 [IU] via SUBCUTANEOUS
  Filled 2012-11-22 (×4): qty 1

## 2012-11-22 MED ORDER — BUPIVACAINE HCL (PF) 0.25 % IJ SOLN
INTRAMUSCULAR | Status: DC | PRN
  Administered 2012-11-22: 28 mL

## 2012-11-22 MED ORDER — 0.9 % SODIUM CHLORIDE (POUR BTL) OPTIME
TOPICAL | Status: DC | PRN
  Administered 2012-11-22: 1000 mL

## 2012-11-22 MED ORDER — PROMETHAZINE HCL 25 MG/ML IJ SOLN: 6.2500 mg | INTRAMUSCULAR | Status: DC | PRN

## 2012-11-22 MED ORDER — HEPARIN SODIUM (PORCINE) 5000 UNIT/ML IJ SOLN
5000.0000 [IU] | Freq: Three times a day (TID) | INTRAMUSCULAR | Status: DC
  Administered 2012-11-22 – 2012-11-27 (×14): 5000 [IU] via SUBCUTANEOUS
  Filled 2012-11-22 (×17): qty 1

## 2012-11-22 MED ORDER — EPHEDRINE SULFATE 50 MG/ML IJ SOLN
INTRAMUSCULAR | Status: DC | PRN
  Administered 2012-11-22: 10 mg via INTRAVENOUS

## 2012-11-22 MED ORDER — METOPROLOL TARTRATE 1 MG/ML IV SOLN
5.0000 mg | Freq: Four times a day (QID) | INTRAVENOUS | Status: DC | PRN
  Administered 2012-11-22 – 2012-11-24 (×2): 5 mg via INTRAVENOUS
  Filled 2012-11-22 (×2): qty 5

## 2012-11-22 MED ORDER — MIDAZOLAM HCL 5 MG/5ML IJ SOLN
INTRAMUSCULAR | Status: DC | PRN
  Administered 2012-11-22: 2 mg via INTRAVENOUS

## 2012-11-22 MED ORDER — DEXAMETHASONE SODIUM PHOSPHATE 10 MG/ML IJ SOLN
INTRAMUSCULAR | Status: DC | PRN
  Administered 2012-11-22: 10 mg via INTRAVENOUS

## 2012-11-22 MED ORDER — LACTATED RINGERS IR SOLN
Status: DC | PRN
  Administered 2012-11-22: 1

## 2012-11-22 SURGICAL SUPPLY — 38 items
APPLIER CLIP ROT 10 11.4 M/L (STAPLE) ×2
BENZOIN TINCTURE PRP APPL 2/3 (GAUZE/BANDAGES/DRESSINGS) IMPLANT
CANISTER SUCTION 2500CC (MISCELLANEOUS) ×2 IMPLANT
CHLORAPREP W/TINT 26ML (MISCELLANEOUS) ×2 IMPLANT
CHOLANGIOGRAM CATH TAUT (CATHETERS) ×2 IMPLANT
CLIP APPLIE ROT 10 11.4 M/L (STAPLE) ×1 IMPLANT
CLOTH BEACON ORANGE TIMEOUT ST (SAFETY) ×2 IMPLANT
COVER MAYO STAND STRL (DRAPES) ×2 IMPLANT
DECANTER SPIKE VIAL GLASS SM (MISCELLANEOUS) IMPLANT
DERMABOND ADVANCED (GAUZE/BANDAGES/DRESSINGS) ×1
DERMABOND ADVANCED .7 DNX12 (GAUZE/BANDAGES/DRESSINGS) ×1 IMPLANT
DRAPE C-ARM 42X120 X-RAY (DRAPES) ×2 IMPLANT
DRAPE LAPAROSCOPIC ABDOMINAL (DRAPES) ×2 IMPLANT
ELECT REM PT RETURN 9FT ADLT (ELECTROSURGICAL) ×2
ELECTRODE REM PT RTRN 9FT ADLT (ELECTROSURGICAL) ×1 IMPLANT
GLOVE BIOGEL PI IND STRL 7.0 (GLOVE) ×1 IMPLANT
GLOVE BIOGEL PI INDICATOR 7.0 (GLOVE) ×1
GLOVE SURG SIGNA 7.5 PF LTX (GLOVE) ×2 IMPLANT
GOWN STRL NON-REIN LRG LVL3 (GOWN DISPOSABLE) IMPLANT
GOWN STRL REIN XL XLG (GOWN DISPOSABLE) ×6 IMPLANT
HEMOSTAT SURGICEL 4X8 (HEMOSTASIS) IMPLANT
IV CATH 14GX2 1/4 (CATHETERS) ×2 IMPLANT
IV SET EXT 30 76VOL 4 MALE LL (IV SETS) ×2 IMPLANT
KIT BASIN OR (CUSTOM PROCEDURE TRAY) ×2 IMPLANT
NS IRRIG 1000ML POUR BTL (IV SOLUTION) ×2 IMPLANT
POUCH SPECIMEN RETRIEVAL 10MM (ENDOMECHANICALS) ×2 IMPLANT
SET IRRIG TUBING LAPAROSCOPIC (IRRIGATION / IRRIGATOR) ×2 IMPLANT
SOLUTION ANTI FOG 6CC (MISCELLANEOUS) ×2 IMPLANT
STRIP CLOSURE SKIN 1/4X4 (GAUZE/BANDAGES/DRESSINGS) IMPLANT
SUT VIC AB 5-0 PS2 18 (SUTURE) ×2 IMPLANT
TOWEL OR 17X26 10 PK STRL BLUE (TOWEL DISPOSABLE) ×4 IMPLANT
TRAY LAP CHOLE (CUSTOM PROCEDURE TRAY) ×2 IMPLANT
TROCAR XCEL BLUNT TIP 100MML (ENDOMECHANICALS) ×2 IMPLANT
TROCAR Z-THREAD FIOS 11X100 BL (TROCAR) ×2 IMPLANT
TROCAR Z-THREAD FIOS 5X100MM (TROCAR) ×4 IMPLANT
TROCAR Z-THREAD SLEEVE 11X100 (TROCAR) IMPLANT
TUBING INSUFFLATION 10FT LAP (TUBING) ×2 IMPLANT
WATER STERILE IRR 1500ML POUR (IV SOLUTION) ×2 IMPLANT

## 2012-11-22 NOTE — Transfer of Care (Signed)
Immediate Anesthesia Transfer of Care Note  Patient: Amber Cuevas  Procedure(s) Performed: Procedure(s) (LRB): LAPAROSCOPIC CHOLECYSTECTOMY WITH INTRAOPERATIVE CHOLANGIOGRAM (N/A)  Patient Location: PACU  Anesthesia Type: General  Level of Consciousness: sedated, patient cooperative and responds to stimulaton  Airway & Oxygen Therapy: Patient Spontanous Breathing and Patient connected to face mask oxgen  Post-op Assessment: Report given to PACU RN and Post -op Vital signs reviewed and stable  Post vital signs: Reviewed and stable  Complications: No apparent anesthesia complications

## 2012-11-22 NOTE — Anesthesia Postprocedure Evaluation (Signed)
  Anesthesia Post-op Note  Patient: Amber Cuevas  Procedure(s) Performed: Procedure(s) (LRB): LAPAROSCOPIC CHOLECYSTECTOMY WITH INTRAOPERATIVE CHOLANGIOGRAM (N/A)  Patient Location: PACU  Anesthesia Type: General  Level of Consciousness: awake and alert   Airway and Oxygen Therapy: Patient Spontanous Breathing  Post-op Pain: mild  Post-op Assessment: Post-op Vital signs reviewed, Patient's Cardiovascular Status Stable, Respiratory Function Stable, Patent Airway and No signs of Nausea or vomiting  Last Vitals:  Filed Vitals:   11/22/12 1600  BP: 143/87  Pulse: 86  Temp: 36.7 C  Resp: 16    Post-op Vital Signs: stable   Complications: No apparent anesthesia complications

## 2012-11-22 NOTE — H&P (Signed)
Amber Cuevas is an 42 y.o. female.   Chief Complaint: abdominal pain, nausea and vomiting HPI: patient is a 42 year old female initially began having episodic epigastric and right upper quadrant abdominal pain about 2 months ago. She was evaluated by her primary physician including a gallbladder ultrasound showing multiple small gallstones. She was seen in our office about one month ago and scheduled for elective cholecystectomy by Dr. Derrell Lolling. Since that time she has been having almost daily episodes of pain and nausea but these would completely resolve after an hour or 2. She states that about 3 days ago the pain became worse and has been constant for about 2 days with frequent nausea and vomiting and she's been unable to hold down food or liquids. She denies fever but states she has been occasionally chilled. No jaundice.  Past Medical History  Diagnosis Date  . Hypertension     Past Surgical History  Procedure Laterality Date  . Abdominal hysterectomy    . Appendectomy    . Neck surgery    . Hand surgery      No family history on file. Social History:  reports that she has never smoked. She has never used smokeless tobacco. She reports that she does not drink alcohol or use illicit drugs.  Allergies:  Allergies  Allergen Reactions  . Codeine     Rash and itching    Medications Prior to Admission  Medication Sig Dispense Refill  . estradiol (ESTRACE) 2 MG tablet Take 2 mg by mouth daily.      Marland Kitchen GARLIC PO Take 1 tablet by mouth daily as needed (blood pressure).      Marland Kitchen ibuprofen (ADVIL,MOTRIN) 200 MG tablet Take 400-800 mg by mouth every 6 (six) hours as needed for pain.         Results for orders placed during the hospital encounter of 11/21/12 (from the past 48 hour(s))  CBC WITH DIFFERENTIAL     Status: None   Collection Time    11/21/12  8:47 PM      Result Value Range   WBC 7.9  4.0 - 10.5 K/uL   RBC 4.83  3.87 - 5.11 MIL/uL   Hemoglobin 13.8  12.0 - 15.0 g/dL   HCT  09.8  11.9 - 14.7 %   MCV 83.2  78.0 - 100.0 fL   MCH 28.6  26.0 - 34.0 pg   MCHC 34.3  30.0 - 36.0 g/dL   RDW 82.9  56.2 - 13.0 %   Platelets 273  150 - 400 K/uL   Neutrophils Relative % 57  43 - 77 %   Neutro Abs 4.5  1.7 - 7.7 K/uL   Lymphocytes Relative 34  12 - 46 %   Lymphs Abs 2.7  0.7 - 4.0 K/uL   Monocytes Relative 7  3 - 12 %   Monocytes Absolute 0.6  0.1 - 1.0 K/uL   Eosinophils Relative 1  0 - 5 %   Eosinophils Absolute 0.1  0.0 - 0.7 K/uL   Basophils Relative 0  0 - 1 %   Basophils Absolute 0.0  0.0 - 0.1 K/uL  COMPREHENSIVE METABOLIC PANEL     Status: Abnormal   Collection Time    11/21/12  8:47 PM      Result Value Range   Sodium 139  135 - 145 mEq/L   Potassium 3.5  3.5 - 5.1 mEq/L   Chloride 102  96 - 112 mEq/L   CO2 22  19 - 32 mEq/L   Glucose, Bld 92  70 - 99 mg/dL   BUN 13  6 - 23 mg/dL   Creatinine, Ser 4.78  0.50 - 1.10 mg/dL   Calcium 29.5  8.4 - 62.1 mg/dL   Total Protein 8.2  6.0 - 8.3 g/dL   Albumin 4.4  3.5 - 5.2 g/dL   AST 21  0 - 37 U/L   ALT 26  0 - 35 U/L   Alkaline Phosphatase 81  39 - 117 U/L   Total Bilirubin 0.3  0.3 - 1.2 mg/dL   GFR calc non Af Amer 77 (*) >90 mL/min   GFR calc Af Amer 89 (*) >90 mL/min   Comment:            The eGFR has been calculated     using the CKD EPI equation.     This calculation has not been     validated in all clinical     situations.     eGFR's persistently     <90 mL/min signify     possible Chronic Kidney Disease.  LIPASE, BLOOD     Status: None   Collection Time    11/21/12  8:47 PM      Result Value Range   Lipase 21  11 - 59 U/L   No results found.  ROS  Blood pressure 167/109, pulse 86, temperature 97.6 F (36.4 C), temperature source Oral, resp. rate 20, height 5\' 3"  (1.6 m), weight 173 lb (78.472 kg), SpO2 94.00%. Physical Exam  General: Moderately overweight African American female very drowsy post narcotics in the emergency department but arousable Skin: No rash or infection HEENT:  Sclerae are nonicteric. Pupils equal and reactive. Oropharynx clear. Healed right neck incision. No masses or thyromegaly Lungs: Clear equal breath sounds without wheezing or increased work of breathing Cardiac: Regular rate and rhythm. No murmurs. No edema. Abdomen: Nondistended. Mild right upper quadrant tenderness. No masses or organomegaly appreciated. Extremities: No joint swelling or edema or deformity Neurologic: She is very drowsy but arousable and conversant and then traced back to sleep. Has had recent narcotics.  Assessment/Plan No gallstones with worsening episodes of pain consistent with biliary colic. She now has constant pain and vomiting. White count LFTs are unremarkable but she likely has early acute cholecystitis or persistent biliary colic. Due to the severity of her symptoms she is admitted for urgent laparoscopic cholecystectomy. I discussed the procedure with her and she understands but currently is really too sedated to have a cogent conversation regarding risks. She has been counseled extensively that her previous office visit. She has a diagnosis of hypertension but on no regular medications. Blood pressure is currently elevated. Will treat with IV Lopressor for now.  Leylani Duley T 11/22/2012, 1:08 AM

## 2012-11-22 NOTE — Anesthesia Preprocedure Evaluation (Signed)
Anesthesia Evaluation  Patient identified by MRN, date of birth, ID band Patient awake    Reviewed: Allergy & Precautions, H&P , NPO status , Patient's Chart, lab work & pertinent test results  Airway Mallampati: II TM Distance: >3 FB Neck ROM: Full    Dental no notable dental hx.    Pulmonary neg pulmonary ROS,  breath sounds clear to auscultation  Pulmonary exam normal       Cardiovascular hypertension, Pt. on medications negative cardio ROS  Rhythm:Regular Rate:Normal     Neuro/Psych negative neurological ROS  negative psych ROS   GI/Hepatic negative GI ROS, Neg liver ROS,   Endo/Other  negative endocrine ROS  Renal/GU negative Renal ROS  negative genitourinary   Musculoskeletal negative musculoskeletal ROS (+)   Abdominal   Peds negative pediatric ROS (+)  Hematology negative hematology ROS (+)   Anesthesia Other Findings   Reproductive/Obstetrics negative OB ROS                           Anesthesia Physical Anesthesia Plan  ASA: II  Anesthesia Plan: General   Post-op Pain Management:    Induction: Intravenous  Airway Management Planned: Oral ETT  Additional Equipment:   Intra-op Plan:   Post-operative Plan: Extubation in OR  Informed Consent: I have reviewed the patients History and Physical, chart, labs and discussed the procedure including the risks, benefits and alternatives for the proposed anesthesia with the patient or authorized representative who has indicated his/her understanding and acceptance.   Dental advisory given  Plan Discussed with: CRNA  Anesthesia Plan Comments:         Anesthesia Quick Evaluation  

## 2012-11-22 NOTE — Progress Notes (Signed)
Reviewed history and exam.  Discussed with Dr. Johna Sheriff.  She had already seen Dr. Derrell Lolling and had planned elective cholecystectomy.  Discussed with patient.  Patient with symptomatic gall stones.  I discussed with the patient the indications and risks of gall bladder surgery.  The primary risks of gall bladder surgery include, but are not limited to, bleeding, infection, common bile duct injury, and open surgery.  There is also the risk that the patient may have continued symptoms after surgery.  However, the likelihood of improvement in symptoms and return to the patient's normal status is good. We discussed the typical post-operative recovery course. I tried to answer the patient's questions.  For surgery later this AM.  Ovidio Kin, MD, [facility] Surgery Pager: [phone] Office phone:  [phone]

## 2012-11-22 NOTE — Preoperative (Signed)
Beta Blockers   Reason not to administer Beta Blockers:Not Applicable 

## 2012-11-22 NOTE — Care Management Note (Signed)
    Page 1 of 1   11/22/2012     2:16:17 PM   CARE MANAGEMENT NOTE 11/22/2012  Patient:  Amber Cuevas, Amber Cuevas   Account Number:  [id number]  Date Initiated:  11/22/2012  Documentation initiated by:  Lorenda Ishihara  Subjective/Objective Assessment:   42 yo female admitted with abd pain, s/p lap chole. PTA lived at home with spouse.     Action/Plan:   Home when stable   Anticipated DC Date:  11/23/2012   Anticipated DC Plan:  HOME/SELF CARE      DC Planning Services  CM consult      Choice offered to / List presented to:             Status of service:  Completed, signed off Medicare Important Message given?   (If response is "NO", the following Medicare IM given date fields will be blank) Date Medicare IM given:   Date Additional Medicare IM given:    Discharge Disposition:  HOME/SELF CARE  Per UR Regulation:  Reviewed for med. necessity/level of care/duration of stay  If discussed at Long Length of Stay Meetings, dates discussed:    Comments:

## 2012-11-22 NOTE — Op Note (Signed)
11/21/2012 - 11/22/2012  11:45 AM  PATIENT:  Amber Cuevas, 42 y.o., female, MRN: [id number]  PREOP DIAGNOSIS:  gallstones  POSTOP DIAGNOSIS:   Cholecystitis, cholelithiasis  PROCEDURE:   Procedure(s): LAPAROSCOPIC CHOLECYSTECTOMY WITH INTRAOPERATIVE CHOLANGIOGRAM  SURGEON:   Ovidio Kin, M.D.  ASSISTANTGordy Savers, M.D.  ANESTHESIA:   general  Anesthesiologist: Phillips Grout, MD CRNA: Mechele Dawley, CRNA  General  ASA: @asa @  EBL:  Minimal  ml  BLOOD ADMINISTERED: none  DRAINS: none   LOCAL MEDICATIONS USED:   30 cc 1/4% marcaine  SPECIMEN:   Gall bladder  COUNTS CORRECT:  YES  INDICATIONS FOR PROCEDURE:  Amber Cuevas is a 42 y.o. (DOB: 15-Dec-1970) AA  female whose primary care physician is FULP, CAMMIE, MD and comes for cholecystectomy.  She was admitted through the ER for abdominal pain.   The indications and risks of the gall bladder surgery were explained to the patient.  The risks include, but are not limited to, infection, bleeding, common bile duct injury and open surgery.  SURGERY:  The patient was taken to room #11 at [facility].  The abdomen was prepped with chloroprep.  The patient was given 2 gm Ancef at the beginning of the operation.   A time out was held and the surgical checklist run.   An infraumbilical incision was made into the abdominal cavity.  A 12 mm Hasson trocar was inserted into the abdominal cavity through the infraumbilical incision and secured with a 0 Vicryl suture.  Three additional trocars were inserted: a 10 mm trocar in the sub-xiphoid location, a 5 mm trocar in the right mid subcostal area, and a 5 mm trocar in the right lateral subcostal area.   The abdomen was explored and the liver, stomach, and bowel that could be seen were unremarkable.   The gall bladder was identified, grasped, and rotated cephalad.  There were adhesions about 1/2 way up the gall bladder that were more chronic than acute.  Disssection was  carried down to the gall bladder/cystic duct junction and the cystic duct isolated.  A clip was placed on the gall bladder side of the cystic duct.   An intra-operative cholangiogram was shot.   The intra-operative cholangiogram was shot using a cut off Taut catheter placed through a 14 gauge angiocath in the RUQ.  The Taut catheter was inserted in the cut cystic duct and secured with an endoclip.  A cholangiogram was shot with 10 cc of 1/2 strength Omnipaque.  Using fluoroscopy, the cholangiogram showed the flow of contrast into the common bile duct, up the hepatic radicals, and into the duodenum.  The CBD seemed to empty into the duodenum at the junction of the 2nd and 3rd portion of the duodenum. There was no mass or obstruction.  This was a normal intra-operative cholangiogram.   The Taut catheter was removed.  The cystic duct was tripley endoclipped and the cystic artery was identified and clipped.  The gall bladder was bluntly and sharpley dissected from the gall bladder bed.   After the gall bladder was removed from the liver, the gall bladder bed and Triangle of [place] were inspected.  There was no bleeding or bile leak.  The gall bladder was placed in a endocatch bag and delivered through the umbilicus.  The abdomen was irrigated with 500 cc saline.   The trocars were then removed.  I infiltrated 30 cc of 1/4% Marcaine into the incisions.  The umbilical port closed with  a 0 Vicryl suture and the skin closed with 5-0 vicryl.  The skin was painted with Dermabond.  The patient's sponge and needle count were correct.  The patient was transported to the RR in good condition.  Ovidio Kin, MD, [facility] Surgery Pager: [phone] Office phone:  [phone]

## 2012-11-23 ENCOUNTER — Encounter (HOSPITAL_COMMUNITY): Payer: Self-pay | Admitting: Surgery

## 2012-11-23 NOTE — Progress Notes (Signed)
Patient ID: HANEEFAH VENTURINI, female   DOB: [birth year], 42 y.o.   MRN: [id number] 1 Day Post-Op  Subjective: Pt nauseated today.  Threw up some earlier this morning.  Hasn't eaten much.  Pain not well controlled yet either.  Taking oral and IV meds still  Objective: Vital signs in last 24 hours: Temp:  [97.7 F (36.5 C)-98.6 F (37 C)] 98 F (36.7 C) (06/13 0957) Pulse Rate:  [63-90] 74 (06/13 0957) Resp:  [14-18] 16 (06/13 0957) BP: (129-179)/(18-106) 138/83 mmHg (06/13 0957) SpO2:  [96 %-100 %] 100 % (06/13 0957) Last BM Date: 11/20/12  Intake/Output from previous day: 06/12 0701 - 06/13 0700 In: 2983.3 [I.V.:2983.3] Out: 3400 [Urine:3400] Intake/Output this shift:    PE: Abd: soft, tender appropriately, +BS, incisions c/d/i  Lab Results:   Recent Labs  11/21/12 2047  WBC 7.9  HGB 13.8  HCT 40.2  PLT 273   BMET  Recent Labs  11/21/12 2047  NA 139  K 3.5  CL 102  CO2 22  GLUCOSE 92  BUN 13  CREATININE 0.90  CALCIUM 10.2   PT/INR No results found for this basename: LABPROT, INR,  in the last 72 hours CMP     Component Value Date/Time   NA 139 11/21/2012 2047   K 3.5 11/21/2012 2047   CL 102 11/21/2012 2047   CO2 22 11/21/2012 2047   GLUCOSE 92 11/21/2012 2047   BUN 13 11/21/2012 2047   CREATININE 0.90 11/21/2012 2047   CALCIUM 10.2 11/21/2012 2047   PROT 8.2 11/21/2012 2047   ALBUMIN 4.4 11/21/2012 2047   AST 21 11/21/2012 2047   ALT 26 11/21/2012 2047   ALKPHOS 81 11/21/2012 2047   BILITOT 0.3 11/21/2012 2047   GFRNONAA 77* 11/21/2012 2047   GFRAA 89* 11/21/2012 2047   Lipase     Component Value Date/Time   LIPASE 21 11/21/2012 2047       Studies/Results: Dg Cholangiogram Operative  11/22/2012   *RADIOLOGY REPORT*  Clinical Data: Right upper quadrant pain.  INTRAOPERATIVE CHOLANGIOGRAM  Technique:  C-arm fluoroscopic images were obtained intraoperatively and submitted for postoperative interpretation. Please see the performing provider's procedural  report for the fluoroscopy time utilized.  Comparison: Ultrasound 10/26/2012  Findings: Opacification of the common bile duct and duodenum. Incomplete filling of the proximal common hepatic duct and intrahepatic ducts.  Difficult to exclude a filling defect in the proximal common hepatic duct.  Distal biliary system is not dilated.  IMPRESSION: Common bile duct is patent without an obstructing lesion.  Cannot exclude a filling defect in the proximal common hepatic duct.   Original Report Authenticated By: Richarda Overlie, M.D.    Anti-infectives: Anti-infectives   Start     Dose/Rate Route Frequency Ordered Stop   11/22/12 0145  ertapenem (INVANZ) 1 g in sodium chloride 0.9 % 50 mL IVPB     1 g 100 mL/hr over 30 Minutes Intravenous Daily at bedtime 11/22/12 0129         Assessment/Plan  1. S/p lap chole - D. Sheza Strickland - 11/22/2012  Plan: 1. Try to get nausea and pain better controlled, if able will try for dc this afternoon, but suspect it will be tomorrow 2. Try to mobilize as this may help with #1.   LOS: 2 days    OSBORNE,KELLY E 11/23/2012, 10:54 AM Pager: [phone]  Doing better, but not ready to go home.  Both sons in room. Agree with above.  Ovidio Kin, MD, [facility]  Surgery Pager: [phone] Office phone:  [phone]

## 2012-11-23 NOTE — Progress Notes (Signed)
Pt refused oral pain medicine, says that it is does not help her pain, administering IV pain medication.  Will continue to monitor   Amber Cuevas L

## 2012-11-24 NOTE — Progress Notes (Signed)
Patient ID: Amber Cuevas, female   DOB: [birth year], 42 y.o.   MRN: [id number] [facility] Surgery Progress Note:   2 Days Post-Op  Subjective: Mental status is clear.  Main complaint is nausea and vomiting,  No flatus Objective: Vital signs in last 24 hours: Temp:  [98 F (36.7 C)-98.6 F (37 C)] 98 F (36.7 C) (06/14 0526) Pulse Rate:  [60-86] 60 (06/14 0628) Resp:  [16-18] 18 (06/14 0526) BP: (130-159)/(72-97) 136/96 mmHg (06/14 0628) SpO2:  [97 %-100 %] 98 % (06/14 0526)  Intake/Output from previous day: 06/13 0701 - 06/14 0700 In: 3038.3 [P.O.:480; I.V.:2408.3; IV Piggyback:150] Out: 3350 [Urine:3350] Intake/Output this shift: Total I/O In: 400 [I.V.:400] Out: -   Physical Exam: Work of breathing is normal.  No acute pain but no flatus  Lab Results:  No results found for this or any previous visit (from the past 48 hour(s)).  Radiology/Results: Dg Cholangiogram Operative  11/22/2012   *RADIOLOGY REPORT*  Clinical Data: Right upper quadrant pain.  INTRAOPERATIVE CHOLANGIOGRAM  Technique:  C-arm fluoroscopic images were obtained intraoperatively and submitted for postoperative interpretation. Please see the performing provider's procedural report for the fluoroscopy time utilized.  Comparison: Ultrasound 10/26/2012  Findings: Opacification of the common bile duct and duodenum. Incomplete filling of the proximal common hepatic duct and intrahepatic ducts.  Difficult to exclude a filling defect in the proximal common hepatic duct.  Distal biliary system is not dilated.  IMPRESSION: Common bile duct is patent without an obstructing lesion.  Cannot exclude a filling defect in the proximal common hepatic duct.   Original Report Authenticated By: Richarda Overlie, M.D.    Anti-infectives: Anti-infectives   Start     Dose/Rate Route Frequency Ordered Stop   11/22/12 0145  ertapenem (INVANZ) 1 g in sodium chloride 0.9 % 50 mL IVPB     1 g 100 mL/hr over 30 Minutes Intravenous Daily at  bedtime 11/22/12 0129        Assessment/Plan: Problem List: Patient Active Problem List   Diagnosis Date Noted  . Gallstones 10/30/2012    Postop ileus.  Has been going between liquids and solids and is unable to keep anything down.  Not ready for discharge yet. Encouraged to get up and walk.   2 Days Post-Op    LOS: 3 days   Matt B. Daphine Deutscher, MD, [facility] [phone] beeper [phone]  11/24/2012 10:57 AM

## 2012-11-25 MED ORDER — MAGNESIUM HYDROXIDE 400 MG/5ML PO SUSP
30.0000 mL | Freq: Once | ORAL | Status: AC
  Administered 2012-11-25: 30 mL via ORAL
  Filled 2012-11-25: qty 30

## 2012-11-25 MED ORDER — FLEET ENEMA 7-19 GM/118ML RE ENEM
1.0000 | ENEMA | Freq: Once | RECTAL | Status: AC
  Administered 2012-11-25: 1 via RECTAL
  Filled 2012-11-25: qty 1

## 2012-11-25 MED ORDER — VITAMINS A & D EX OINT
TOPICAL_OINTMENT | CUTANEOUS | Status: AC
  Filled 2012-11-25: qty 5

## 2012-11-25 MED ORDER — MAGNESIUM HYDROXIDE NICU ORAL SYRINGE 400 MG/5 ML: 30.0000 mL | Freq: Once | ORAL | Status: DC

## 2012-11-25 NOTE — Progress Notes (Signed)
Patient ID: Amber Cuevas, female   DOB: 01/06/1971, 42 y.o.   MRN: [id number] [facility] Surgery Progress Note:   3 Days Post-Op  Subjective: Mental status is clear.  No pain but vomited last night and not consistently keeping things down.   Objective: Vital signs in last 24 hours: Temp:  [98.1 F (36.7 C)-98.5 F (36.9 C)] 98.1 F (36.7 C) (06/15 0500) Pulse Rate:  [66-76] 66 (06/15 0500) Resp:  [16-20] 20 (06/15 0500) BP: (139-158)/(90-101) 142/90 mmHg (06/15 0500) SpO2:  [95 %-100 %] 98 % (06/15 0500)  Intake/Output from previous day: 06/14 0701 - 06/15 0700 In: 2400 [I.V.:2400] Out: 3700 [Urine:3700] Intake/Output this shift: Total I/O In: 60 [P.O.:60] Out: -   Physical Exam: Work of breathing is normal.  Abdomen is not tender;  Bowel sounds are present.    Lab Results:  No results found for this or any previous visit (from the past 48 hour(s)).  Radiology/Results: No results found.  Anti-infectives: Anti-infectives   Start     Dose/Rate Route Frequency Ordered Stop   11/22/12 0145  ertapenem (INVANZ) 1 g in sodium chloride 0.9 % 50 mL IVPB     1 g 100 mL/hr over 30 Minutes Intravenous Daily at bedtime 11/22/12 0129        Assessment/Plan: Problem List: Patient Active Problem List   Diagnosis Date Noted  . Gallstones 10/30/2012    Ileus resolving.  Will give enema to expedite.  Delay discharge until tolerating PO 3 Days Post-Op    LOS: 4 days   Matt B. Daphine Deutscher, MD, [facility] [phone] beeper [phone]  11/25/2012 9:03 AM

## 2012-11-25 NOTE — H&P (Signed)
Amber Cuevas   MRN:  [id number]   Description: 42 year old female  Provider: Ernestene Mention, MD  Department: [facility]       Diagnoses    Gallstones    -  Primary    574.20          Current Vitals    BP Pulse Temp(Src) Resp Ht Wt    118/82 72 98.1 F (36.7 C) (Oral) 18 5\' 3"  (1.6 m) 176 lb (79.833 kg)    BMI 31.18 kg/m2                                                     HPI Amber Cuevas is a 42 y.o. female.  She is referred by Dr. Hipolito Bayley in the emergency department for management of symptomatic gallstones. Her primary care physician is Dr. Cain Saupe.   The patient gives a one-month history of intermittent episodes of right upper quadrant postprandial pain. This became worse and became a daily problem last week. She's been to the emergency room twice. All of her lab work is normal. Her ultrasound showed small gallstones but no other acute finding. Her chest x-ray is normal. She had transient diarrhea but this is now resolved. She denies fever or chills. Denies jaundice.   Past history significant for laparoscopic robotic hysterectomy and BSO by Dr. Henderson Cloud. Open appendectomy. Cervical disc surgery. Right carpal tunnel surgery.   She states that she is having daily pain. She does not appear to be in any distress today.        Past Medical History   Diagnosis  Date   .  Hypertension           Past Surgical History   Procedure  Laterality  Date   .  Abdominal hysterectomy       .  Appendectomy       .  Neck surgery       .  Hand surgery            No family history on file.   Social History History   Substance Use Topics   .  Smoking status:  Never Smoker    .  Smokeless tobacco:  Not on file   .  Alcohol Use:  No         Allergies   Allergen  Reactions   .  Codeine         Rash and itching         Current Outpatient Prescriptions   Medication  Sig  Dispense  Refill   .  estradiol (ESTRACE) 2  MG tablet  Take 2 mg by mouth daily.         Marland Kitchen  ibuprofen (ADVIL,MOTRIN) 200 MG tablet  Take 800 mg by mouth every 6 (Cuevas) hours as needed for pain.                Review of Systems  Constitutional: Negative for fever, chills and unexpected weight change.  HENT: Negative for hearing loss, congestion, sore throat, trouble swallowing and voice change.   Eyes: Negative for visual disturbance.  Respiratory: Negative for cough and wheezing.   Cardiovascular: Negative for chest pain, palpitations and leg swelling.  Gastrointestinal: Positive for nausea, vomiting, abdominal pain and diarrhea. Negative for constipation, blood  in stool, abdominal distention and anal bleeding.  Genitourinary: Negative for hematuria, vaginal bleeding and difficulty urinating.  Musculoskeletal: Negative for arthralgias.  Skin: Negative for rash and wound.  Neurological: Negative for seizures, syncope and headaches.  Hematological: Negative for adenopathy. Does not bruise/bleed easily.  Psychiatric/Behavioral: Negative for confusion.      Blood pressure 118/82, pulse 72, temperature 98.1 F (36.7 C), temperature source Oral, resp. rate 18, height 5\' 3"  (1.6 m), weight 176 lb (79.833 kg).   Physical Exam  Constitutional: She is oriented to person, place, and time. She appears well-developed and well-nourished. No distress.  HENT:   Head: Normocephalic and atraumatic.   Nose: Nose normal.   Mouth/Throat: No oropharyngeal exudate.  Eyes: Conjunctivae and EOM are normal. Pupils are equal, round, and reactive to light. Left eye exhibits no discharge. No scleral icterus.  Neck: Neck supple. No JVD present. No tracheal deviation present. No thyromegaly present.  Cardiovascular: Normal rate, regular rhythm, normal heart sounds and intact distal pulses.    No murmur heard. Pulmonary/Chest: Effort normal and breath sounds normal. No respiratory distress. She has no wheezes. She has no rales. She exhibits no  tenderness.  Tattoo above left breast  Abdominal: Soft. Bowel sounds are normal. She exhibits no distension and no mass. There is no tenderness. There is no rebound and no guarding.  Her abdomen is actually generally soft without guarding mass or distention. Transverse right lower quadrant scar. Multiple trocar sites from hysterectomy. No hernias noted.  Tattooed circumferentially around the umbilicus.  Musculoskeletal: She exhibits no edema and no tenderness.  Lymphadenopathy:    She has no cervical adenopathy.  Neurological: She is alert and oriented to person, place, and time. She exhibits normal muscle tone. Coordination normal.  Skin: Skin is warm. No rash noted. She is not diaphoretic. No erythema. No pallor.  Psychiatric: She has a normal mood and affect. Her behavior is normal. Judgment and thought content normal.      Data Reviewed Emergency department physician notes, a lab work, chest x-ray, ultrasound   Assessment    Chronic cholecystitis with cholelithiasis. Now with accelerating biliary colic.   History robotic TAH and BSO   History of open appendectomy   History cervical disc surgery.      Plan    Schedule for laparoscopic cholecystectomy with cholangiogram, possible open.   Low-fat diet and Vicodin in the interim   I discussed the indications, details, techniques and numerous risks of the surgery with her. We discussed the risk of bleeding, infection, bile leak, conversion to open laparotomy, injury to adjacent organs, cardiac pulmonary and thromboembolic problems. Unforeseen complications. She understands all these issues and all of her questions are answered. She agrees with this plan.           Angelia Mould. Derrell Lolling, M.D., [facility] General and Minimally invasive Surgery Breast and Colorectal Surgery Office:   [phone] Pager:   [phone]

## 2012-11-26 NOTE — Progress Notes (Signed)
Patient ID: Amber Cuevas, female   DOB: 08-14-1970, 41 y.o.   MRN: [id number] 4 Days Post-Op  Subjective: Doing better but still no BM, some nausea but tolerating diet ok, pain controlled  Objective: Vital signs in last 24 hours: Temp:  [97.3 F (36.3 C)-98.5 F (36.9 C)] 98.1 F (36.7 C) (06/16 0640) Pulse Rate:  [74-96] 74 (06/16 0640) Resp:  [16] 16 (06/16 0640) BP: (127-143)/(87-91) 143/91 mmHg (06/16 0640) SpO2:  [94 %-98 %] 94 % (06/16 0640) Last BM Date: 11/25/12  Intake/Output from previous day: 06/15 0701 - 06/16 0700 In: 2580 [P.O.:180; I.V.:2400] Out: 1650 [Urine:1650] Intake/Output this shift:    PE: Abd: soft, mild tenderness at umbilicus, +BS, incisions c/d/i General; awake, alert, NAD  Lab Results:  No results found for this basename: WBC, HGB, HCT, PLT,  in the last 72 hours BMET No results found for this basename: NA, K, CL, CO2, GLUCOSE, BUN, CREATININE, CALCIUM,  in the last 72 hours PT/INR No results found for this basename: LABPROT, INR,  in the last 72 hours CMP     Component Value Date/Time   NA 139 11/21/2012 2047   K 3.5 11/21/2012 2047   CL 102 11/21/2012 2047   CO2 22 11/21/2012 2047   GLUCOSE 92 11/21/2012 2047   BUN 13 11/21/2012 2047   CREATININE 0.90 11/21/2012 2047   CALCIUM 10.2 11/21/2012 2047   PROT 8.2 11/21/2012 2047   ALBUMIN 4.4 11/21/2012 2047   AST 21 11/21/2012 2047   ALT 26 11/21/2012 2047   ALKPHOS 81 11/21/2012 2047   BILITOT 0.3 11/21/2012 2047   GFRNONAA 77* 11/21/2012 2047   GFRAA 89* 11/21/2012 2047   Lipase     Component Value Date/Time   LIPASE 21 11/21/2012 2047       Studies/Results: No results found.  Anti-infectives: Anti-infectives   Start     Dose/Rate Route Frequency Ordered Stop   11/22/12 0145  ertapenem (INVANZ) 1 g in sodium chloride 0.9 % 50 mL IVPB     1 g 100 mL/hr over 30 Minutes Intravenous Daily at bedtime 11/22/12 0129         Assessment/Plan 1. S/p lap chole - D. Newman -  11/22/2012  Plan: 1. Regular diet this am, poss need supp later today if unable to move bowels, could go home tomorrow if bowels moving and tolerating diet 2. Ambulate in halls TID  LOS: 5 days    Phill Steck 11/26/2012, 9:33 AM Office phone:  [phone]

## 2012-11-26 NOTE — Progress Notes (Signed)
11/26/2012 Colleen Can BSN RN CCM [phone] Pt has experienced nausea with breakfast. Has not had BM. Plans to go home with family upon discharge. No HH or DME needs noted at this time.

## 2012-11-27 ENCOUNTER — Ambulatory Visit (HOSPITAL_COMMUNITY)
Admission: RE | Admit: 2012-11-27 | Payer: Managed Care, Other (non HMO) | Source: Ambulatory Visit | Admitting: General Surgery

## 2012-11-27 ENCOUNTER — Encounter (HOSPITAL_COMMUNITY): Admission: RE | Payer: Self-pay | Source: Ambulatory Visit

## 2012-11-27 SURGERY — LAPAROSCOPIC CHOLECYSTECTOMY WITH INTRAOPERATIVE CHOLANGIOGRAM
Anesthesia: General

## 2012-11-27 MED ORDER — IBUPROFEN 800 MG PO TABS: 800.0000 mg | ORAL_TABLET | Freq: Four times a day (QID) | ORAL | Status: DC | PRN

## 2012-11-27 MED ORDER — POLYETHYLENE GLYCOL 3350 17 G PO PACK: 17.0000 g | PACK | Freq: Every day | ORAL | Status: DC | PRN

## 2012-11-27 MED ORDER — DSS 100 MG PO CAPS: 100.0000 mg | ORAL_CAPSULE | Freq: Two times a day (BID) | ORAL | Status: DC | PRN

## 2012-11-27 MED ORDER — POLYETHYLENE GLYCOL 3350 17 G PO PACK
17.0000 g | PACK | Freq: Every day | ORAL | Status: DC | PRN
  Filled 2012-11-27: qty 1

## 2012-11-27 MED ORDER — HYDROCODONE-ACETAMINOPHEN 5-325 MG PO TABS: 1.0000 | ORAL_TABLET | ORAL | Status: DC | PRN

## 2012-11-27 MED ORDER — DOCUSATE SODIUM 100 MG PO CAPS
100.0000 mg | ORAL_CAPSULE | Freq: Two times a day (BID) | ORAL | Status: DC | PRN
  Filled 2012-11-27: qty 1

## 2012-11-27 NOTE — Discharge Summary (Signed)
  Physician Discharge Summary  Patient ID: Amber Cuevas MRN: [id number] DOB/AGE: [birth year] 43 y.o.  Admit date: 11/21/2012 Discharge date: 11/27/2012  Admitting Diagnosis: Back pain Chest pain   Discharge Diagnosis Cholecystitis, cholelithiasis  Consultants None  Procedures Laparoscopic Cholecystectomy with [facility]  Hospital Course 42 yr old female who presented to [facility] with back pain and chest pain.  Workup showed cholecystitis and cholelithiasis.  Patient was admitted and underwent procedure listed above.  Tolerated procedure well and was transferred to the floor.  She did have a post-operative ileus but this did resolve.  Diet was advanced as tolerated.  On POD#5, the patient was voiding well, tolerating diet, ambulating well, pain well controlled, vital signs stable, incisions c/d/i and felt stable for discharge home.  Patient will follow up in our office in 2 weeks and knows to call with questions or concerns.  Physical Exam: VSS afebrile General: awake, NAD Abd: soft, mildly tender, +BS, incisions c/d/i    Medication List    TAKE these medications       DSS 100 MG Caps  Take 100 mg by mouth 2 (two) times daily as needed.     estradiol 2 MG tablet  Commonly known as:  ESTRACE  Take 2 mg by mouth daily.     GARLIC PO  Take 1 tablet by mouth daily as needed (blood pressure).     HYDROcodone-acetaminophen 5-325 MG per tablet  Commonly known as:  NORCO/VICODIN  Take 1-2 tablets by mouth every 4 (four) hours as needed.     ibuprofen 800 MG tablet  Commonly known as:  ADVIL,MOTRIN  Take 1 tablet (800 mg total) by mouth every 6 (six) hours as needed.     polyethylene glycol packet  Commonly known as:  MIRALAX / GLYCOLAX  Take 17 g by mouth daily as needed.             Follow-up Information   Follow up with [facility] On 12/11/2012. (12:30pm, arrive at 12:15pm)    Contact information:   [address]   [place]  [address] [phone]       Signed: Denny Levy [facility] [phone]  11/27/2012, 8:07 AM

## 2012-11-27 NOTE — Progress Notes (Signed)
Patient states understanding of discharge instructions. Rx for percocet, ibprofen, miralax and colace given to patient.

## 2012-12-11 ENCOUNTER — Encounter (INDEPENDENT_AMBULATORY_CARE_PROVIDER_SITE_OTHER): Payer: Self-pay

## 2012-12-11 ENCOUNTER — Ambulatory Visit (INDEPENDENT_AMBULATORY_CARE_PROVIDER_SITE_OTHER): Payer: Private Health Insurance - Indemnity | Admitting: Internal Medicine

## 2012-12-11 VITALS — BP 122/74 | HR 80 | Temp 96.3°F | Resp 18 | Ht 63.0 in | Wt 175.2 lb

## 2012-12-11 DIAGNOSIS — K810 Acute cholecystitis: Secondary | ICD-10-CM

## 2012-12-11 DIAGNOSIS — K8020 Calculus of gallbladder without cholecystitis without obstruction: Secondary | ICD-10-CM

## 2012-12-11 NOTE — Progress Notes (Signed)
  Subjective: Pt returns to the clinic today after undergoing laparoscopic cholecystectomy on 11/22/12.  The patient is tolerating their diet well and is having no severe pain.  Bowel function is good.  No problems with the wounds.  Objective: Vital signs in last 24 hours: Reviewed  PE: Abd: soft, non-tender, +bs, incisions well healed  Lab Results:  No results found for this basename: WBC, HGB, HCT, PLT,  in the last 72 hours BMET No results found for this basename: NA, K, CL, CO2, GLUCOSE, BUN, CREATININE, CALCIUM,  in the last 72 hours PT/INR No results found for this basename: LABPROT, INR,  in the last 72 hours CMP     Component Value Date/Time   NA 139 11/21/2012 2047   K 3.5 11/21/2012 2047   CL 102 11/21/2012 2047   CO2 22 11/21/2012 2047   GLUCOSE 92 11/21/2012 2047   BUN 13 11/21/2012 2047   CREATININE 0.90 11/21/2012 2047   CALCIUM 10.2 11/21/2012 2047   PROT 8.2 11/21/2012 2047   ALBUMIN 4.4 11/21/2012 2047   AST 21 11/21/2012 2047   ALT 26 11/21/2012 2047   ALKPHOS 81 11/21/2012 2047   BILITOT 0.3 11/21/2012 2047   GFRNONAA 77* 11/21/2012 2047   GFRAA 89* 11/21/2012 2047   Lipase     Component Value Date/Time   LIPASE 21 11/21/2012 2047       Studies/Results: No results found.  Anti-infectives: Anti-infectives   None       Assessment/Plan  1.  S/P Laparoscopic Cholecystectomy: doing well, may resume regular activity without restrictions, Pt will follow up with [place] PRN and knows to call with questions or concerns.     Odean Fester 12/11/2012

## 2012-12-11 NOTE — Patient Instructions (Signed)
May resume regular activity without restrictions. Follow up as needed. Call with questions or concerns.  

## 2013-06-10 ENCOUNTER — Ambulatory Visit (HOSPITAL_BASED_OUTPATIENT_CLINIC_OR_DEPARTMENT_OTHER): Payer: Managed Care, Other (non HMO) | Attending: Family Medicine | Admitting: Radiology

## 2013-06-10 VITALS — Ht 64.0 in | Wt 172.0 lb

## 2013-06-10 DIAGNOSIS — R0989 Other specified symptoms and signs involving the circulatory and respiratory systems: Secondary | ICD-10-CM | POA: Insufficient documentation

## 2013-06-10 DIAGNOSIS — G4763 Sleep related bruxism: Secondary | ICD-10-CM | POA: Insufficient documentation

## 2013-06-10 DIAGNOSIS — R0609 Other forms of dyspnea: Secondary | ICD-10-CM | POA: Insufficient documentation

## 2013-06-10 DIAGNOSIS — G4733 Obstructive sleep apnea (adult) (pediatric): Secondary | ICD-10-CM

## 2013-06-10 DIAGNOSIS — G4710 Hypersomnia, unspecified: Secondary | ICD-10-CM | POA: Insufficient documentation

## 2013-06-15 DIAGNOSIS — G4733 Obstructive sleep apnea (adult) (pediatric): Secondary | ICD-10-CM

## 2013-06-15 NOTE — Sleep Study (Signed)
   NAME: Amber Cuevas DATE OF BIRTH:  05-23-1971 MEDICAL RECORD NUMBER [id number]  LOCATION: [facility]  PHYSICIAN: YOUNG,CLINTON D  DATE OF STUDY: 06/10/2013  SLEEP STUDY TYPE: Nocturnal Polysomnogram               REFERRING PHYSICIAN: Fulp, Cammie, MD  INDICATION FOR STUDY: Hypersomnia with sleep apnea  EPWORTH SLEEPINESS SCORE:   18/24 HEIGHT: 5\' 4"  (162.6 cm)  WEIGHT: 172 lb (78.019 kg)    Body mass index is 29.51 kg/(m^2).  NECK SIZE: 14 in.  MEDICATIONS: Charted for review  SLEEP ARCHITECTURE: Total sleep time 286 minutes with sleep efficiency 78.5%. Stage I was 6.6%, stage II 66.8%, stage III 12.1%, REM 14.5% of total sleep time. Sleep latency 21.5 minutes, REM latency 102 minutes, awake after sleep onset 57 minutes, arousal index 18.7. That time medication: None  RESPIRATORY DATA: Apnea hypopnea index (AHI) 3.6 per hour. A total of 17 events was scored including 5 obstructive apneas, 1 mixed apnea, 11 hypopneas. Most events were associated with supine sleep position. REM AHI 7.2 per hour. There were not enough events to permit application of split protocol CPAP titration.  OXYGEN DATA: Moderate snoring with oxygen desaturation to a nadir of 89% and mean oxygen saturation through the study of 96.5% on room air.  CARDIAC DATA: Sinus rhythm with PACs and PVCs  MOVEMENT/PARASOMNIA: A total of 12 limb jerks were counted of which 4 were associated with arousal or awakening for periodic limb movement with arousal index of 0.8 per hour. Bathroom x1. Bruxism events were noted.  IMPRESSION/ RECOMMENDATION:   1) Unremarkable sleep architecture for sleep center environment without medication.  2) Occasional respiratory events with sleep disturbance, within normal limits. AHI 3.6 per hour(normal range for adults is an AHI from 0-5 events per hour). Moderate snoring with oxygen desaturation to a nadir of 89% and mean oxygen saturation through the study of  96.5% on room air. 3) Bruxism was noted.  Signed Baird Lyons M.D. Deneise Lever Diplomate, American Board of Sleep Medicine  ELECTRONICALLY SIGNED ON:  06/15/2013, 9:52 AM [place] PH: [phone]   FX: [phone] [place]

## 2013-09-11 ENCOUNTER — Emergency Department (HOSPITAL_COMMUNITY)
Admission: EM | Admit: 2013-09-11 | Discharge: 2013-09-12 | Disposition: A | Discharge status: Home / Self Care | Payer: Managed Care, Other (non HMO) | Attending: Emergency Medicine | Admitting: Emergency Medicine

## 2013-09-11 ENCOUNTER — Emergency Department (HOSPITAL_COMMUNITY): Payer: Managed Care, Other (non HMO)

## 2013-09-11 ENCOUNTER — Encounter (HOSPITAL_COMMUNITY): Payer: Self-pay | Admitting: Emergency Medicine

## 2013-09-11 DIAGNOSIS — I10 Essential (primary) hypertension: Secondary | ICD-10-CM | POA: Insufficient documentation

## 2013-09-11 DIAGNOSIS — R079 Chest pain, unspecified: Secondary | ICD-10-CM

## 2013-09-11 DIAGNOSIS — Z79899 Other long term (current) drug therapy: Secondary | ICD-10-CM | POA: Insufficient documentation

## 2013-09-11 DIAGNOSIS — R0789 Other chest pain: Secondary | ICD-10-CM | POA: Insufficient documentation

## 2013-09-11 LAB — CBC
HCT: 39.9 % (ref 36.0–46.0)
Hemoglobin: 13.3 g/dL (ref 12.0–15.0)
MCH: 28.9 pg (ref 26.0–34.0)
MCHC: 33.3 g/dL (ref 30.0–36.0)
MCV: 86.7 fL (ref 78.0–100.0)
PLATELETS: 251 10*3/uL (ref 150–400)
RBC: 4.6 MIL/uL (ref 3.87–5.11)
RDW: 13.7 % (ref 11.5–15.5)
WBC: 8.2 10*3/uL (ref 4.0–10.5)

## 2013-09-11 LAB — BASIC METABOLIC PANEL
BUN: 11 mg/dL (ref 6–23)
CALCIUM: 9.1 mg/dL (ref 8.4–10.5)
CO2: 25 meq/L (ref 19–32)
CREATININE: 0.81 mg/dL (ref 0.50–1.10)
Chloride: 105 mEq/L (ref 96–112)
GFR calc Af Amer: >90 mL/min (ref >90)
GFR, EST NON AFRICAN AMERICAN: 88 mL/min — AB (ref >90)
GLUCOSE: 83 mg/dL (ref 70–99)
Potassium: 3.7 mEq/L (ref 3.7–5.3)
Sodium: 143 mEq/L (ref 137–147)

## 2013-09-11 LAB — I-STAT TROPONIN, ED: TROPONIN I, POC: 0 ng/mL (ref 0.00–0.08)

## 2013-09-11 NOTE — ED Provider Notes (Signed)
CSN: [id number]     Arrival date & time 09/11/13  2143 History   First MD Initiated Contact with Patient 09/11/13 2315     Chief Complaint  Patient presents with  . Chest Pain     (Consider location/radiation/quality/duration/timing/severity/associated sxs/prior Treatment) HPI Comments: Patient is a 43 year old female with no prior cardiac history. She presents today with complaints of sharp pain in her chest that has been occurring intermittently for the past 10 days, but has become constant over the past 2 days. She denies any shortness of breath, diaphoresis, nausea. She denies any relation to food. She denies any exertional component.  Patient is a 43 y.o. female presenting with chest pain. The history is provided by the patient.  Chest Pain Pain location:  Substernal area Pain quality: sharp   Pain radiates to:  R shoulder Pain radiates to the back: no   Pain severity:  Moderate Onset quality:  Sudden Duration:  10 days Progression:  Worsening Chronicity:  New Context: not breathing and no movement   Relieved by:  Nothing Worsened by:  Nothing tried   Past Medical History  Diagnosis Date  . Hypertension    Past Surgical History  Procedure Laterality Date  . Abdominal hysterectomy    . Appendectomy    . Neck surgery    . Hand surgery    . Cholecystectomy N/A 11/22/2012    Procedure: LAPAROSCOPIC CHOLECYSTECTOMY WITH INTRAOPERATIVE CHOLANGIOGRAM;  Surgeon: Shann Medal, MD;  Location: [facility];  Service: General;  Laterality: N/A;   No family history on file. History  Substance Use Topics  . Smoking status: Never Smoker   . Smokeless tobacco: Never Used  . Alcohol Use: No   OB History   Grav Para Term Preterm Abortions TAB SAB Ect Mult Living                 Review of Systems  Cardiovascular: Positive for chest pain.  All other systems reviewed and are negative.      Allergies  Codeine  Home Medications   Current Outpatient Rx  Name  Route  Sig   Dispense  Refill  . estradiol (ESTRACE) 2 MG tablet   Oral   Take 2 mg by mouth daily.         Marland Kitchen GARLIC PO   Oral   Take 1 tablet by mouth daily as needed (blood pressure).          BP 153/100  Pulse 77  Temp(Src) 98.8 F (37.1 C) (Oral)  Resp 18  SpO2 97% Physical Exam  Nursing note and vitals reviewed. Constitutional: She is oriented to person, place, and time. She appears well-developed and well-nourished. No distress.  HENT:  Head: Normocephalic and atraumatic.  Neck: Normal range of motion. Neck supple.  Cardiovascular: Normal rate and regular rhythm.  Exam reveals no gallop and no friction rub.   No murmur heard. Pulmonary/Chest: Effort normal and breath sounds normal. No respiratory distress. She has no wheezes.  Abdominal: Soft. Bowel sounds are normal. She exhibits no distension. There is no tenderness.  Musculoskeletal: Normal range of motion.  Neurological: She is alert and oriented to person, place, and time.  Skin: Skin is warm and dry. She is not diaphoretic.    ED Course  Procedures (including critical care time) Labs Review Labs Reviewed  BASIC METABOLIC PANEL - Abnormal; Notable for the following:    GFR calc non Af Amer 88 (*)    All other components within normal limits  CBC  I-STAT TROPOININ, ED   Imaging Review Dg Chest 2 View  09/11/2013   CLINICAL DATA:  Mid to right sided chest pain for 2 weeks. Right arm aches.  EXAM: CHEST  2 VIEW  COMPARISON:  Chest radiograph performed 10/26/2012  FINDINGS: The lungs are well-aerated and clear. There is no evidence of focal opacification, pleural effusion or pneumothorax.  The heart is normal in size; the mediastinal contour is within normal limits. No acute osseous abnormalities are seen. Clips are noted within the right upper quadrant, reflecting prior cholecystectomy.  IMPRESSION: No acute cardiopulmonary process seen.   Electronically Signed   By: Garald Balding M.D.   On: 09/11/2013 22:14     EKG  Interpretation   Date/Time:  Wednesday September 11 2013 21:47:38 EDT Ventricular Rate:  76 PR Interval:  174 QRS Duration: 84 QT Interval:  370 QTC Calculation: 416 R Axis:   51 Text Interpretation:  Normal sinus rhythm Normal ECG Confirmed by DELOS   MD, Dewain Platz ([id number]) on 09/11/2013 11:16:17 PM      MDM   Final diagnoses:  None    Patient is a 43 year old female who presents with complaints of chest pain radiating to the right shoulder. His been present for days and her EKG is normal troponin is negative. I suspect a musculoskeletal etiology and will recommend anti-inflammatories. She will also be given the followup information for cardiology with whom she can arrange followup to discuss possible stress testing.    Veryl Speak, MD 09/11/13 [phone]

## 2013-09-11 NOTE — ED Notes (Signed)
Pt states right chest pain going into her right arm that started 10 days prior and has intensified since.  No states no other symptoms at this time.  Pain is not made better or worse by anything

## 2013-09-11 NOTE — Discharge Instructions (Signed)
Ibuprofen 600 mg 3 times daily for the next 5 days.  Call [facility] cardiology to arrange a followup appointment to discuss possible stress testing. Return to the ER if your symptoms substantially worsen or change for the worse.   Chest Pain (Nonspecific) It is often hard to give a specific diagnosis for the cause of chest pain. There is always a chance that your pain could be related to something serious, such as a heart attack or a blood clot in the lungs. You need to follow up with your caregiver for further evaluation. CAUSES   Heartburn.  Pneumonia or bronchitis.  Anxiety or stress.  Inflammation around your heart (pericarditis) or lung (pleuritis or pleurisy).  A blood clot in the lung.  A collapsed lung (pneumothorax). It can develop suddenly on its own (spontaneous pneumothorax) or from injury (trauma) to the chest.  Shingles infection (herpes zoster virus). The chest wall is composed of bones, muscles, and cartilage. Any of these can be the source of the pain.  The bones can be bruised by injury.  The muscles or cartilage can be strained by coughing or overwork.  The cartilage can be affected by inflammation and become sore (costochondritis). DIAGNOSIS  Lab tests or other studies, such as X-rays, electrocardiography, stress testing, or cardiac imaging, may be needed to find the cause of your pain.  TREATMENT   Treatment depends on what may be causing your chest pain. Treatment may include:  Acid blockers for heartburn.  Anti-inflammatory medicine.  Pain medicine for inflammatory conditions.  Antibiotics if an infection is present.  You may be advised to change lifestyle habits. This includes stopping smoking and avoiding alcohol, caffeine, and chocolate.  You may be advised to keep your head raised (elevated) when sleeping. This reduces the chance of acid going backward from your stomach into your esophagus.  Most of the time, nonspecific chest pain will improve  within 2 to 3 days with rest and mild pain medicine. HOME CARE INSTRUCTIONS   If antibiotics were prescribed, take your antibiotics as directed. Finish them even if you start to feel better.  For the next few days, avoid physical activities that bring on chest pain. Continue physical activities as directed.  Do not smoke.  Avoid drinking alcohol.  Only take over-the-counter or prescription medicine for pain, discomfort, or fever as directed by your caregiver.  Follow your caregiver's suggestions for further testing if your chest pain does not go away.  Keep any follow-up appointments you made. If you do not go to an appointment, you could develop lasting (chronic) problems with pain. If there is any problem keeping an appointment, you must call to reschedule. SEEK MEDICAL CARE IF:   You think you are having problems from the medicine you are taking. Read your medicine instructions carefully.  Your chest pain does not go away, even after treatment.  You develop a rash with blisters on your chest. SEEK IMMEDIATE MEDICAL CARE IF:   You have increased chest pain or pain that spreads to your arm, neck, jaw, back, or abdomen.  You develop shortness of breath, an increasing cough, or you are coughing up blood.  You have severe back or abdominal pain, feel nauseous, or vomit.  You develop severe weakness, fainting, or chills.  You have a fever. THIS IS AN EMERGENCY. Do not wait to see if the pain will go away. Get medical help at once. Call your local emergency services (911 in [place] Do not drive yourself to the hospital. MAKE  SURE YOU:   Understand these instructions.  Will watch your condition.  Will get help right away if you are not doing well or get worse. Document Released: 03/09/2005 Document Revised: 08/22/2011 Document Reviewed: 01/03/2008 [facility] Patient Information 2014 [place].

## 2013-09-19 ENCOUNTER — Ambulatory Visit (INDEPENDENT_AMBULATORY_CARE_PROVIDER_SITE_OTHER): Payer: Private Health Insurance - Indemnity | Admitting: Surgery

## 2013-09-19 ENCOUNTER — Encounter (INDEPENDENT_AMBULATORY_CARE_PROVIDER_SITE_OTHER): Payer: Self-pay | Admitting: Surgery

## 2013-09-19 VITALS — BP 122/78 | HR 80 | Temp 97.5°F | Ht 63.0 in | Wt 175.0 lb

## 2013-09-19 DIAGNOSIS — D171 Benign lipomatous neoplasm of skin and subcutaneous tissue of trunk: Secondary | ICD-10-CM

## 2013-09-19 DIAGNOSIS — D1779 Benign lipomatous neoplasm of other sites: Secondary | ICD-10-CM

## 2013-09-19 NOTE — Progress Notes (Signed)
Re:   Amber Cuevas DOB:   Sep 12, 1970 MRN:   [id number]  ASSESSMENT AND PLAN: 1.  Lipoma of right upper back, 8 x 11 cm  Discussed the indication and complications of removal.  The main complications are bleeding, infection, nerve injury, and recurrence of the mass.  It has become larger for her over the last 6 months and it is causing more pain in that she is taking ibuprofen daily.  I gave her literature on the lipomas.  She wants to set up the operation to get it removed.  Chief Complaint  Patient presents with  . eval rt scapula   REFERRING PHYSICIAN: FULP, CAMMIE, MD  HISTORY OF PRESENT ILLNESS: Amber Cuevas is a 43 y.o. (DOB: 1971/05/23)  AA  female whose primary care physician is FULP, CAMMIE, MD and comes to me today for mass on her right back. She comes by herself.  She has noticed the area on her back for about 6 months.  But over the last few months, the mass has become larger and is causing enough discomfort that she is taking ibuprofen daily.   She also pointed to a area on her upper chest, but this a the angle of her sternum and I think is "normal" anatomy. She has no other history of skin lesions or masses.   Past Medical History  Diagnosis Date  . Hypertension     Past Surgical History  Procedure Laterality Date  . Abdominal hysterectomy    . Appendectomy    . Neck surgery    . Hand surgery    . Cholecystectomy N/A 11/22/2012    Procedure: LAPAROSCOPIC CHOLECYSTECTOMY WITH INTRAOPERATIVE CHOLANGIOGRAM;  Surgeon: Shann Medal, MD;  Location: [facility];  Service: General;  Laterality: N/A;     Current Outpatient Prescriptions  Medication Sig Dispense Refill  . lisinopril (PRINIVIL,ZESTRIL) 10 MG tablet        No current facility-administered medications for this visit.     Allergies  Allergen Reactions  . Codeine     Rash and itching    REVIEW OF SYSTEMS: Skin:  No history of rash.  No history of abnormal moles. Infection:  No  history of hepatitis or HIV.  No history of MRSA. Neurologic:  No history of stroke.  No history of seizure.  No history of headaches. Cardiac:  No history of hypertension. No history of heart disease.  No history of prior cardiac catheterization.  No history of seeing a cardiologist. Pulmonary:  She had a sleep study by Dr. Annamaria Boots on 06/15/2013, which was negative.  Endocrine:  No diabetes. No thyroid disease. Gastrointestinal:  No history of stomach disease.  No history of liver disease.  Lap chole - 11/21/2012 - D. Dimitrious Micciche.  She originally saw Dr. Dalbert Batman, and Barnie Del saw her in follow up.  No history of pancreas disease.  No history of colon disease. Urologic:  No history of kidney stones.  No history of bladder infections. Musculoskeletal:  No history of joint or back disease. Hematologic:  No bleeding disorder.  No history of anemia.  Not anticoagulated. Psycho-social:  The patient is oriented.   The patient has no obvious psychologic or social impairment to understanding our conversation and plan.  SOCIAL and FAMILY HISTORY: Married. She works in Multimedia programmer at [employer]: BP 122/78  Pulse 80  Temp(Src) 97.5 F (36.4 C) (Oral)  Ht 5\' 3"  (1.6 m)  Wt 175 lb (79.379 kg)  BMI 31.01  kg/m2  General: WN AA F who is alert and generally healthy appearing.  HEENT: Normal. Pupils equal. Neck: Supple. No mass.  No thyroid mass. Lymph Nodes:  No supraclavicular or cervical nodes. Lungs: Clear to auscultation and symmetric breath sounds.  Back - 8 x 11 cm mass in the right upper back.  Anterior chest - she has a prominent angle of her sternum on the left upper chest Heart:  RRR. No murmur or rub. Extremities:  Good strength and ROM  in upper and lower extremities. Neurologic:  Grossly intact to motor and sensory function. Psychiatric: Has normal mood and affect. Behavior is normal.    Right back  DATA REVIEWED: Epic notes.  Alphonsa Overall, MD,  [facility]  Surgery, [address]  [place], [place]    [place] Phone:  [phone] FAX:  [phone]

## 2013-10-04 ENCOUNTER — Ambulatory Visit
Admission: RE | Admit: 2013-10-04 | Discharge: 2013-10-04 | Disposition: A | Discharge status: Home / Self Care | Payer: Managed Care, Other (non HMO) | Source: Ambulatory Visit | Attending: Family Medicine | Admitting: Family Medicine

## 2013-10-04 ENCOUNTER — Other Ambulatory Visit: Payer: Self-pay | Admitting: Family Medicine

## 2013-10-04 DIAGNOSIS — M549 Dorsalgia, unspecified: Secondary | ICD-10-CM

## 2013-10-04 DIAGNOSIS — M545 Low back pain, unspecified: Secondary | ICD-10-CM

## 2013-10-04 DIAGNOSIS — R319 Hematuria, unspecified: Secondary | ICD-10-CM

## 2013-10-25 ENCOUNTER — Other Ambulatory Visit (INDEPENDENT_AMBULATORY_CARE_PROVIDER_SITE_OTHER): Payer: Self-pay | Admitting: Surgery

## 2013-10-25 DIAGNOSIS — D1739 Benign lipomatous neoplasm of skin and subcutaneous tissue of other sites: Secondary | ICD-10-CM

## 2013-10-28 ENCOUNTER — Other Ambulatory Visit (INDEPENDENT_AMBULATORY_CARE_PROVIDER_SITE_OTHER): Payer: Self-pay

## 2013-10-28 MED ORDER — HYDROCODONE-ACETAMINOPHEN 5-325 MG PO TABS
1.0000 | ORAL_TABLET | Freq: Four times a day (QID) | ORAL | Status: DC | PRN
Start: 2013-10-28 — End: 2015-11-21

## 2013-10-29 ENCOUNTER — Telehealth (INDEPENDENT_AMBULATORY_CARE_PROVIDER_SITE_OTHER): Payer: Self-pay | Admitting: General Surgery

## 2013-10-29 NOTE — Telephone Encounter (Signed)
Pt had lipoma removed from her back at [facility] last week.  Ever since, she has had nausea and vomiting.  Doesn't think she is running a fever; has been taking Zofran sublingual without much help.  Verified she is restricting her intake to non-caffeinated beverages and sipping gingerale, broth, etc.  Reassured her this is not sequelae from anesthesia, but more liking she has been exposed to a virus.  Encouraged her to continue her regimen of intake.  She voiced comfort in knowing this was most likely not related to her surgery.

## 2013-11-07 ENCOUNTER — Ambulatory Visit (INDEPENDENT_AMBULATORY_CARE_PROVIDER_SITE_OTHER): Payer: Private Health Insurance - Indemnity | Admitting: Surgery

## 2013-11-07 ENCOUNTER — Encounter (INDEPENDENT_AMBULATORY_CARE_PROVIDER_SITE_OTHER): Payer: Self-pay | Admitting: Surgery

## 2013-11-07 VITALS — BP 126/64 | HR 80 | Temp 98.0°F | Resp 18 | Ht 69.0 in | Wt 173.0 lb

## 2013-11-07 DIAGNOSIS — D1779 Benign lipomatous neoplasm of other sites: Secondary | ICD-10-CM

## 2013-11-07 DIAGNOSIS — D171 Benign lipomatous neoplasm of skin and subcutaneous tissue of trunk: Secondary | ICD-10-CM

## 2013-11-07 NOTE — Progress Notes (Signed)
   Re:   Amber Cuevas DOB:   09-07-1970 MRN:   [id number]  ASSESSMENT AND PLAN: 1.  Lipoma of right upper back, 8 x 11 cm  Excised at the [place]- 10/25/2013 - D. Analy Bassford  She has a seroma in the incision, but this should reabsorb.  Her return appointment is PRN.   Chief Complaint  Patient presents with  . Routine Post Op    lipoma   REFERRING PHYSICIAN: FULP, CAMMIE, MD  HISTORY OF PRESENT ILLNESS: Amber Cuevas is a 43 y.o. (DOB: [birth year])  AA  female whose primary care physician is FULP, CAMMIE, MD and comes to me today for mass on her right back. She comes by herself. She looks good.  I gave her a copy of the path report.  We talked about the scar - I gave her the suggestion of Mederma.  And I talked about the seroma in the wound.  History of lipoma: She has noticed the area on her back for about 6 months.  But over the last few months, the mass has become larger and is causing enough discomfort that she is taking ibuprofen daily.   She also pointed to a area on her upper chest, but this a the angle of her sternum and I think is "normal" anatomy. She has no other history of skin lesions or masses.   Past Medical History  Diagnosis Date  . Hypertension     Past Surgical History  Procedure Laterality Date  . Abdominal hysterectomy    . Appendectomy    . Neck surgery    . Hand surgery    . Cholecystectomy N/A 11/22/2012    Procedure: LAPAROSCOPIC CHOLECYSTECTOMY WITH INTRAOPERATIVE CHOLANGIOGRAM;  Surgeon: Shann Medal, MD;  Location: [facility];  Service: General;  Laterality: N/A;     Current Outpatient Prescriptions  Medication Sig Dispense Refill  . HYDROcodone-acetaminophen (NORCO) 5-325 MG per tablet Take 1 tablet by mouth every 6 (six) hours as needed for moderate pain.  30 tablet  0  . lisinopril (PRINIVIL,ZESTRIL) 10 MG tablet        No current facility-administered medications for this visit.     Allergies  Allergen Reactions  .  Codeine     Rash and itching    REVIEW OF SYSTEMS: Pulmonary:  She had a sleep study by Dr. Annamaria Boots on 06/15/2013, which was negative. Gastrointestinal:  No history of stomach disease.  No history of liver disease.  Lap chole - 11/21/2012 - D. Tamra Koos.  She originally saw Dr. Dalbert Batman, and Barnie Del saw her in follow up.  No history of pancreas disease.  No history of colon disease.  SOCIAL and FAMILY HISTORY: Married. 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  HEENT: Normal. Pupils equal. Back - Wound looks good.  She does have a seroma.  I removed the one suture that was there.  DATA REVIEWED: Path report to patient.  Alphonsa Overall, MD,  [facility] Surgery, [address]  [place], [place]    [place] Phone:  [phone] FAX:  [phone]

## 2014-08-25 ENCOUNTER — Other Ambulatory Visit: Payer: Self-pay | Admitting: Family Medicine

## 2014-08-25 DIAGNOSIS — Z1231 Encounter for screening mammogram for malignant neoplasm of breast: Secondary | ICD-10-CM

## 2014-09-05 ENCOUNTER — Ambulatory Visit
Admission: RE | Admit: 2014-09-05 | Discharge: 2014-09-05 | Disposition: A | Discharge status: Home / Self Care | Payer: BLUE CROSS/BLUE SHIELD | Source: Ambulatory Visit | Attending: Family Medicine | Admitting: Family Medicine

## 2014-09-05 DIAGNOSIS — Z1231 Encounter for screening mammogram for malignant neoplasm of breast: Secondary | ICD-10-CM

## 2014-11-14 ENCOUNTER — Ambulatory Visit
Admission: RE | Admit: 2014-11-14 | Discharge: 2014-11-14 | Disposition: A | Discharge status: Home / Self Care | Payer: BLUE CROSS/BLUE SHIELD | Source: Ambulatory Visit | Attending: Family Medicine | Admitting: Family Medicine

## 2014-11-14 ENCOUNTER — Other Ambulatory Visit: Payer: Self-pay | Admitting: Family Medicine

## 2014-11-14 DIAGNOSIS — R059 Cough, unspecified: Secondary | ICD-10-CM

## 2014-11-14 DIAGNOSIS — R05 Cough: Secondary | ICD-10-CM

## 2014-11-14 DIAGNOSIS — R0789 Other chest pain: Secondary | ICD-10-CM

## 2014-11-24 ENCOUNTER — Encounter (HOSPITAL_COMMUNITY): Payer: Self-pay

## 2014-11-24 ENCOUNTER — Emergency Department (HOSPITAL_COMMUNITY)
Admission: EM | Admit: 2014-11-24 | Discharge: 2014-11-24 | Disposition: A | Discharge status: Home / Self Care | Payer: BLUE CROSS/BLUE SHIELD | Attending: Emergency Medicine | Admitting: Emergency Medicine

## 2014-11-24 ENCOUNTER — Emergency Department (HOSPITAL_COMMUNITY): Payer: BLUE CROSS/BLUE SHIELD

## 2014-11-24 DIAGNOSIS — R05 Cough: Secondary | ICD-10-CM | POA: Insufficient documentation

## 2014-11-24 DIAGNOSIS — R059 Cough, unspecified: Secondary | ICD-10-CM

## 2014-11-24 DIAGNOSIS — I10 Essential (primary) hypertension: Secondary | ICD-10-CM | POA: Diagnosis not present

## 2014-11-24 DIAGNOSIS — R112 Nausea with vomiting, unspecified: Secondary | ICD-10-CM | POA: Diagnosis not present

## 2014-11-24 DIAGNOSIS — R55 Syncope and collapse: Secondary | ICD-10-CM | POA: Diagnosis not present

## 2014-11-24 LAB — BASIC METABOLIC PANEL
Anion gap: 10 (ref 5–15)
BUN: 12 mg/dL (ref 6–20)
CALCIUM: 9.3 mg/dL (ref 8.9–10.3)
CO2: 21 mmol/L — ABNORMAL LOW (ref 22–32)
Chloride: 108 mmol/L (ref 101–111)
Creatinine, Ser: 0.98 mg/dL (ref 0.44–1.00)
GFR calc non Af Amer: >60 mL/min (ref >60)
Glucose, Bld: 89 mg/dL (ref 65–99)
POTASSIUM: 4.3 mmol/L (ref 3.5–5.1)
Sodium: 139 mmol/L (ref 135–145)

## 2014-11-24 LAB — I-STAT CHEM 8, ED
BUN: 14 mg/dL (ref 6–20)
CALCIUM ION: 1.05 mmol/L — AB (ref 1.12–1.23)
Chloride: 107 mmol/L (ref 101–111)
Creatinine, Ser: 1 mg/dL (ref 0.44–1.00)
GLUCOSE: 89 mg/dL (ref 65–99)
HEMATOCRIT: 42 % (ref 36.0–46.0)
HEMOGLOBIN: 14.3 g/dL (ref 12.0–15.0)
Potassium: 4.2 mmol/L (ref 3.5–5.1)
Sodium: 140 mmol/L (ref 135–145)
TCO2: 19 mmol/L (ref 0–100)

## 2014-11-24 LAB — HEPATIC FUNCTION PANEL
ALK PHOS: 87 U/L (ref 38–126)
ALT: 19 U/L (ref 14–54)
AST: 21 U/L (ref 15–41)
Albumin: 4.4 g/dL (ref 3.5–5.0)
BILIRUBIN INDIRECT: 0.8 mg/dL (ref 0.3–0.9)
Bilirubin, Direct: 0.1 mg/dL (ref 0.1–0.5)
Total Bilirubin: 0.9 mg/dL (ref 0.3–1.2)
Total Protein: 6.8 g/dL (ref 6.5–8.1)

## 2014-11-24 LAB — CBC
HCT: 39.7 % (ref 36.0–46.0)
HEMOGLOBIN: 13.3 g/dL (ref 12.0–15.0)
MCH: 28.1 pg (ref 26.0–34.0)
MCHC: 33.5 g/dL (ref 30.0–36.0)
MCV: 83.9 fL (ref 78.0–100.0)
Platelets: 208 10*3/uL (ref 150–400)
RBC: 4.73 MIL/uL (ref 3.87–5.11)
RDW: 13.5 % (ref 11.5–15.5)
WBC: 4.5 10*3/uL (ref 4.0–10.5)

## 2014-11-24 LAB — CBG MONITORING, ED: Glucose-Capillary: 79 mg/dL (ref 65–99)

## 2014-11-24 LAB — LIPASE, BLOOD: Lipase: 18 U/L — ABNORMAL LOW (ref 22–51)

## 2014-11-24 MED ORDER — ONDANSETRON HCL 4 MG/2ML IJ SOLN
4.0000 mg | Freq: Once | INTRAMUSCULAR | Status: AC
  Administered 2014-11-24: 4 mg via INTRAVENOUS
  Filled 2014-11-24: qty 2

## 2014-11-24 MED ORDER — METOCLOPRAMIDE HCL 10 MG PO TABS: 10.0000 mg | ORAL_TABLET | Freq: Four times a day (QID) | ORAL | Status: DC | PRN

## 2014-11-24 MED ORDER — PREDNISONE 20 MG PO TABS: 40.0000 mg | ORAL_TABLET | Freq: Every day | ORAL | Status: DC

## 2014-11-24 MED ORDER — SODIUM CHLORIDE 0.9 % IV BOLUS (SEPSIS)
1000.0000 mL | Freq: Once | INTRAVENOUS | Status: AC
  Administered 2014-11-24: 1000 mL via INTRAVENOUS

## 2014-11-24 MED ORDER — IPRATROPIUM-ALBUTEROL 0.5-2.5 (3) MG/3ML IN SOLN
3.0000 mL | Freq: Once | RESPIRATORY_TRACT | Status: AC
  Administered 2014-11-24: 3 mL via RESPIRATORY_TRACT
  Filled 2014-11-24: qty 3

## 2014-11-24 MED ORDER — METOCLOPRAMIDE HCL 5 MG/ML IJ SOLN
10.0000 mg | Freq: Once | INTRAMUSCULAR | Status: AC
  Administered 2014-11-24: 10 mg via INTRAVENOUS
  Filled 2014-11-24: qty 2

## 2014-11-24 MED ORDER — ALBUTEROL SULFATE HFA 108 (90 BASE) MCG/ACT IN AERS
2.0000 | INHALATION_SPRAY | Freq: Once | RESPIRATORY_TRACT | Status: AC
  Administered 2014-11-24: 2 via RESPIRATORY_TRACT
  Filled 2014-11-24: qty 6.7

## 2014-11-24 NOTE — ED Provider Notes (Signed)
CSN: [id number]     Arrival date & time 11/24/14  1612 History   First MD Initiated Contact with Patient 11/24/14 1616     Chief Complaint  Patient presents with  . Loss of Consciousness     (Consider location/radiation/quality/duration/timing/severity/associated sxs/prior Treatment) HPI   Pt with hx HTN p/w 2 months of cough, multiple rounds of antibiotics and diagnosis of pertussis by PCP, has been unable to keep anything PO down x 6 days and today had a witnesses episode of syncope while vomiting.  States the cough began two weeks ago and seemed "like a regular cold," she had intermittent fevers and has been seen 4 times by PCP with initial round of antibiotic "for pneumonia," unknown name, followed by Z-pak, and now bactrim.  States the cough has actually improved, sputum has changed from thick green/yellow sputum to thick white/yellow sputum.  Has developed a sharp pain in her right back that is painful with breathing and feels "like I'm inhaling gas fumes" (indicates upper airways) while breathing now.  Has had N/V and has been unable to keep anything down.  Today she was being wheeled around in a chair by her colleagues at work, was actively vomiting and then passed out. She did not fall or hit her head.  Is currently lightheaded with standing.  Denies any immobilization, exogenous estrogen, leg swelling prior to the initial symptoms.  Last fever was 4 days ago, finished z-pak 3 days ago.  Denies abdominal pain, diarrhea, CP, SOB.   Pt currently prescribed tessalon perles for cough.    Past Medical History  Diagnosis Date  . Hypertension    Past Surgical History  Procedure Laterality Date  . Abdominal hysterectomy    . Appendectomy    . Neck surgery    . Hand surgery    . Cholecystectomy N/A 11/22/2012    Procedure: LAPAROSCOPIC CHOLECYSTECTOMY WITH INTRAOPERATIVE CHOLANGIOGRAM;  Surgeon: Shann Medal, MD;  Location: [facility];  Service: General;  Laterality: N/A;   No family history  on file. History  Substance Use Topics  . Smoking status: Never Smoker   . Smokeless tobacco: Never Used  . Alcohol Use: No   OB History    No data available     Review of Systems  All other systems reviewed and are negative.     Allergies  Codeine  Home Medications   Prior to Admission medications   Medication Sig Start Date End Date Taking? Authorizing Provider  HYDROcodone-acetaminophen (NORCO) 5-325 MG per tablet Take 1 tablet by mouth every 6 (six) hours as needed for moderate pain. 10/28/13   Alphonsa Overall, MD  lisinopril (PRINIVIL,ZESTRIL) 10 MG tablet  09/12/13   Historical Provider, MD   BP 115/74 mmHg  Pulse 80  Temp(Src) 97.8 F (36.6 C) (Oral)  Resp 20  SpO2 98% Physical Exam  Constitutional: She appears well-developed and well-nourished. No distress.  HENT:  Head: Normocephalic and atraumatic.  Neck: Neck supple.  Cardiovascular: Normal rate and regular rhythm.   Pulmonary/Chest: Effort normal and breath sounds normal. No respiratory distress. She has no wheezes. She has no rales.  Abdominal: Soft. She exhibits no distension. There is no tenderness. There is no rebound and no guarding.  Musculoskeletal:  Bilateral lower extremities without edema or tenderness  Neurological: She is alert.  Skin: She is not diaphoretic.  Nursing note and vitals reviewed.   ED Course  Procedures (including critical care time) Labs Review Labs Reviewed  BASIC METABOLIC PANEL - Abnormal; Notable  for the following:    CO2 21 (*)    All other components within normal limits  LIPASE, BLOOD - Abnormal; Notable for the following:    Lipase 18 (*)    All other components within normal limits  I-STAT CHEM 8, ED - Abnormal; Notable for the following:    Calcium, Ion 1.05 (*)    All other components within normal limits  CBC  HEPATIC FUNCTION PANEL  CBG MONITORING, ED    Imaging Review Dg Chest 2 View  11/24/2014   CLINICAL DATA:  Cough for 2 months. Syncope a for 5 days  including today.  EXAM: CHEST  2 VIEW  COMPARISON:  PA and lateral chest 11/14/2014 and 09/11/2013.  FINDINGS: Heart size and mediastinal contours are within normal limits. Both lungs are clear. Visualized skeletal structures are unremarkable.  IMPRESSION: Negative examination.   Electronically Signed   By: Inge Rise M.D.   On: 11/24/2014 17:22     EKG Interpretation None       ED ECG REPORT   Date: 11/24/2014  Rate: 86  Rhythm: normal sinus rhythm  QRS Axis: normal  Intervals: normal  ST/T Wave abnormalities: early repolarization  Conduction Disutrbances:none  Narrative Interpretation:   Old EKG Reviewed: none available  I have personally reviewed the EKG tracing and agree with the computerized printout as noted.    Discussed patient, workup, and plan with Dr Doy Mince.    MDM   Final diagnoses:  Syncope, non cardiac  Non-intractable vomiting with nausea, vomiting of unspecified type  Cough    Afebrile nontoxic patient who appears clinically dry and weak with 2 months of cough, several rounds of outpatient abx.  No known risk factors for PE.  Tested positive for pertussis by PCP.  Labs unremarkable.  CXR negative.  EKG unremarkable.   IVF, nebs given with improvement.  Pt not on antiemetic or albuterol at home.  On tessalon perles for cough, also has cough medication with hydrocodone in it that she states is very strong, reserves this for severe symptoms.  Pt has been thoroughly covered with antibiotics but has not been on prednisone, has not had albuterol, both of which I have added.  Pt is concerned about strong medications and we had a long discussion about side effects vs benefits of these medications.  I have also added nausea medication.  She is able to tolerate PO in ED and able to ambulate without assistance, is not orthostatic after fluids.  I suspect syncope either as a vagal response while vomiting/coughing or from dehydration.  D/C home with close PCP follow up,  reglan, albuterol, prednisone added to her drug regimen.  Discussed result, findings, treatment, and follow up  with patient.  Pt given return precautions.  Pt verbalizes understanding and agrees with plan.        Clayton Bibles, PA-C 11/24/14 2157  Serita Grit, MD 11/24/14 [phone]

## 2014-11-24 NOTE — ED Notes (Signed)
Pt able to eat saltine crackers and ginger ale without vomiting. Amber Cuevas, [place] informed.

## 2014-11-24 NOTE — ED Notes (Signed)
PER EMS: pt from work, has been having nausea and vomiting since Wednesday and today at work she slumped over in her chair and had syncopal episode lasting about 5 seconds per bystanders. Pt has just finished treatment for Pertussis. A&Ox4 and answering questions appropriately upon EMS arrival.

## 2014-11-24 NOTE — ED Notes (Signed)
Pt [id number]

## 2014-11-24 NOTE — Discharge Instructions (Signed)
Read the information below.  Use the prescribed medication as directed.  Please discuss all new medications with your pharmacist.  You may return to the Emergency Department at any time for worsening condition or any new symptoms that concern you.       Syncope Syncope is a medical term for fainting or passing out. This means you lose consciousness and drop to the ground. People are generally unconscious for less than 5 minutes. You may have some muscle twitches for up to 15 seconds before waking up and returning to normal. Syncope occurs more often in older adults, but it can happen to anyone. While most causes of syncope are not dangerous, syncope can be a sign of a serious medical problem. It is important to seek medical care.  CAUSES  Syncope is caused by a sudden drop in blood flow to the brain. The specific cause is often not determined. Factors that can bring on syncope include:  Taking medicines that lower blood pressure.  Sudden changes in posture, such as standing up quickly.  Taking more medicine than prescribed.  Standing in one place for too long.  Seizure disorders.  Dehydration and excessive exposure to heat.  Low blood sugar (hypoglycemia).  Straining to have a bowel movement.  Heart disease, irregular heartbeat, or other circulatory problems.  Fear, emotional distress, seeing blood, or severe pain. SYMPTOMS  Right before fainting, you may:  Feel dizzy or light-headed.  Feel nauseous.  See all white or all black in your field of vision.  Have cold, clammy skin. DIAGNOSIS  Your health care provider will ask about your symptoms, perform a physical exam, and perform an electrocardiogram (ECG) to record the electrical activity of your heart. Your health care provider may also perform other heart or blood tests to determine the cause of your syncope which may include:  Transthoracic echocardiogram (TTE). During echocardiography, sound waves are used to evaluate how  blood flows through your heart.  Transesophageal echocardiogram (TEE).  Cardiac monitoring. This allows your health care provider to monitor your heart rate and rhythm in real time.  Holter monitor. This is a portable device that records your heartbeat and can help diagnose heart arrhythmias. It allows your health care provider to track your heart activity for several days, if needed.  Stress tests by exercise or by giving medicine that makes the heart beat faster. TREATMENT  In most cases, no treatment is needed. Depending on the cause of your syncope, your health care provider may recommend changing or stopping some of your medicines. HOME CARE INSTRUCTIONS  Have someone stay with you until you feel stable.  Do not drive, use machinery, or play sports until your health care provider says it is okay.  Keep all follow-up appointments as directed by your health care provider.  Lie down right away if you start feeling like you might faint. Breathe deeply and steadily. Wait until all the symptoms have passed.  Drink enough fluids to keep your urine clear or pale yellow.  If you are taking blood pressure or heart medicine, get up slowly and take several minutes to sit and then stand. This can reduce dizziness. SEEK IMMEDIATE MEDICAL CARE IF:   You have a severe headache.  You have unusual pain in the chest, abdomen, or back.  You are bleeding from your mouth or rectum, or you have black or tarry stool.  You have an irregular or very fast heartbeat.  You have pain with breathing.  You have repeated fainting  or seizure-like jerking during an episode.  You faint when sitting or lying down.  You have confusion.  You have trouble walking.  You have severe weakness.  You have vision problems. If you fainted, call your local emergency services (911 in [place] Do not drive yourself to the hospital.  MAKE SURE YOU:  Understand these instructions.  Will watch your  condition.  Will get help right away if you are not doing well or get worse. Document Released: 05/30/2005 Document Revised: 06/04/2013 Document Reviewed: 07/29/2011 [facility] Patient Information 2015 [place], [place]. This information is not intended to replace advice given to you by your health care provider. Make sure you discuss any questions you have with your health care provider.  Nausea and Vomiting Nausea is a sick feeling that often comes before throwing up (vomiting). Vomiting is a reflex where stomach contents come out of your mouth. Vomiting can cause severe loss of body fluids (dehydration). Children and elderly adults can become dehydrated quickly, especially if they also have diarrhea. Nausea and vomiting are symptoms of a condition or disease. It is important to find the cause of your symptoms. CAUSES   Direct irritation of the stomach lining. This irritation can result from increased acid production (gastroesophageal reflux disease), infection, food poisoning, taking certain medicines (such as nonsteroidal anti-inflammatory drugs), alcohol use, or tobacco use.  Signals from the brain.These signals could be caused by a headache, heat exposure, an inner ear disturbance, increased pressure in the brain from injury, infection, a tumor, or a concussion, pain, emotional stimulus, or metabolic problems.  An obstruction in the gastrointestinal tract (bowel obstruction).  Illnesses such as diabetes, hepatitis, gallbladder problems, appendicitis, kidney problems, cancer, sepsis, atypical symptoms of a heart attack, or eating disorders.  Medical treatments such as chemotherapy and radiation.  Receiving medicine that makes you sleep (general anesthetic) during surgery. DIAGNOSIS Your caregiver may ask for tests to be done if the problems do not improve after a few days. Tests may also be done if symptoms are severe or if the reason for the nausea and vomiting is not clear. Tests may  include:  Urine tests.  Blood tests.  Stool tests.  Cultures (to look for evidence of infection).  X-rays or other imaging studies. Test results can help your caregiver make decisions about treatment or the need for additional tests. TREATMENT You need to stay well hydrated. Drink frequently but in small amounts.You may wish to drink water, sports drinks, clear broth, or eat frozen ice pops or gelatin dessert to help stay hydrated.When you eat, eating slowly may help prevent nausea.There are also some antinausea medicines that may help prevent nausea. HOME CARE INSTRUCTIONS   Take all medicine as directed by your caregiver.  If you do not have an appetite, do not force yourself to eat. However, you must continue to drink fluids.  If you have an appetite, eat a normal diet unless your caregiver tells you differently.  Eat a variety of complex carbohydrates (rice, wheat, potatoes, bread), lean meats, yogurt, fruits, and vegetables.  Avoid high-fat foods because they are more difficult to digest.  Drink enough water and fluids to keep your urine clear or pale yellow.  If you are dehydrated, ask your caregiver for specific rehydration instructions. Signs of dehydration may include:  Severe thirst.  Dry lips and mouth.  Dizziness.  Dark urine.  Decreasing urine frequency and amount.  Confusion.  Rapid breathing or pulse. SEEK IMMEDIATE MEDICAL CARE IF:   You have blood or  brown flecks (like coffee grounds) in your vomit.  You have black or bloody stools.  You have a severe headache or stiff neck.  You are confused.  You have severe abdominal pain.  You have chest pain or trouble breathing.  You do not urinate at least once every 8 hours.  You develop cold or clammy skin.  You continue to vomit for longer than 24 to 48 hours.  You have a fever. MAKE SURE YOU:   Understand these instructions.  Will watch your condition.  Will get help right away if  you are not doing well or get worse. Document Released: 05/30/2005 Document Revised: 08/22/2011 Document Reviewed: 10/27/2010 [facility] Patient Information 2015 [place], [place]. This information is not intended to replace advice given to you by your health care provider. Make sure you discuss any questions you have with your health care provider.  Cough, Adult  A cough is a reflex that helps clear your throat and airways. It can help heal the body or may be a reaction to an irritated airway. A cough may only last 2 or 3 weeks (acute) or may last more than 8 weeks (chronic).  CAUSES Acute cough:  Viral or bacterial infections. Chronic cough:  Infections.  Allergies.  Asthma.  Post-nasal drip.  Smoking.  Heartburn or acid reflux.  Some medicines.  Chronic lung problems (COPD).  Cancer. SYMPTOMS   Cough.  Fever.  Chest pain.  Increased breathing rate.  High-pitched whistling sound when breathing (wheezing).  Colored mucus that you cough up (sputum). TREATMENT   A bacterial cough may be treated with antibiotic medicine.  A viral cough must run its course and will not respond to antibiotics.  Your caregiver may recommend other treatments if you have a chronic cough. HOME CARE INSTRUCTIONS   Only take over-the-counter or prescription medicines for pain, discomfort, or fever as directed by your caregiver. Use cough suppressants only as directed by your caregiver.  Use a cold steam vaporizer or humidifier in your bedroom or home to help loosen secretions.  Sleep in a semi-upright position if your cough is worse at night.  Rest as needed.  Stop smoking if you smoke. SEEK IMMEDIATE MEDICAL CARE IF:   You have pus in your sputum.  Your cough starts to worsen.  You cannot control your cough with suppressants and are losing sleep.  You begin coughing up blood.  You have difficulty breathing.  You develop pain which is getting worse or is uncontrolled with  medicine.  You have a fever. MAKE SURE YOU:   Understand these instructions.  Will watch your condition.  Will get help right away if you are not doing well or get worse. Document Released: 11/26/2010 Document Revised: 08/22/2011 Document Reviewed: 11/26/2010 [facility] Patient Information 2015 [place], [place]. This information is not intended to replace advice given to you by your health care provider. Make sure you discuss any questions you have with your health care provider.

## 2015-10-27 ENCOUNTER — Other Ambulatory Visit: Payer: Self-pay

## 2015-10-27 DIAGNOSIS — Z1231 Encounter for screening mammogram for malignant neoplasm of breast: Secondary | ICD-10-CM

## 2015-11-10 ENCOUNTER — Ambulatory Visit
Admission: RE | Admit: 2015-11-10 | Discharge: 2015-11-10 | Disposition: A | Discharge status: Home / Self Care | Payer: BLUE CROSS/BLUE SHIELD | Source: Ambulatory Visit

## 2015-11-10 DIAGNOSIS — Z1231 Encounter for screening mammogram for malignant neoplasm of breast: Secondary | ICD-10-CM

## 2015-11-21 ENCOUNTER — Emergency Department (HOSPITAL_COMMUNITY): Payer: BLUE CROSS/BLUE SHIELD

## 2015-11-21 ENCOUNTER — Emergency Department (HOSPITAL_COMMUNITY)
Admission: EM | Admit: 2015-11-21 | Discharge: 2015-11-21 | Disposition: A | Discharge status: Home / Self Care | Payer: BLUE CROSS/BLUE SHIELD | Attending: Emergency Medicine | Admitting: Emergency Medicine

## 2015-11-21 ENCOUNTER — Encounter (HOSPITAL_COMMUNITY): Payer: Self-pay | Admitting: Emergency Medicine

## 2015-11-21 DIAGNOSIS — R109 Unspecified abdominal pain: Secondary | ICD-10-CM

## 2015-11-21 DIAGNOSIS — I10 Essential (primary) hypertension: Secondary | ICD-10-CM | POA: Diagnosis not present

## 2015-11-21 DIAGNOSIS — Z79899 Other long term (current) drug therapy: Secondary | ICD-10-CM | POA: Insufficient documentation

## 2015-11-21 LAB — URINALYSIS, ROUTINE W REFLEX MICROSCOPIC
BILIRUBIN URINE: NEGATIVE
Glucose, UA: NEGATIVE mg/dL
Hgb urine dipstick: NEGATIVE
Ketones, ur: NEGATIVE mg/dL
LEUKOCYTES UA: NEGATIVE
NITRITE: NEGATIVE
Protein, ur: NEGATIVE mg/dL
SPECIFIC GRAVITY, URINE: 1.033 — AB (ref 1.005–1.030)
pH: 5.5 (ref 5.0–8.0)

## 2015-11-21 LAB — CBC WITH DIFFERENTIAL/PLATELET
Basophils Absolute: 0 10*3/uL (ref 0.0–0.1)
Basophils Relative: 0 %
EOS ABS: 0.1 10*3/uL (ref 0.0–0.7)
Eosinophils Relative: 1 %
HCT: 39.7 % (ref 36.0–46.0)
HEMOGLOBIN: 13.4 g/dL (ref 12.0–15.0)
LYMPHS PCT: 28 %
Lymphs Abs: 2.4 10*3/uL (ref 0.7–4.0)
MCH: 28.2 pg (ref 26.0–34.0)
MCHC: 33.8 g/dL (ref 30.0–36.0)
MCV: 83.4 fL (ref 78.0–100.0)
Monocytes Absolute: 0.6 10*3/uL (ref 0.1–1.0)
Monocytes Relative: 7 %
NEUTROS PCT: 64 %
Neutro Abs: 5.3 10*3/uL (ref 1.7–7.7)
Platelets: 252 10*3/uL (ref 150–400)
RBC: 4.76 MIL/uL (ref 3.87–5.11)
RDW: 13.6 % (ref 11.5–15.5)
WBC: 8.3 10*3/uL (ref 4.0–10.5)

## 2015-11-21 LAB — COMPREHENSIVE METABOLIC PANEL
ALT: 36 U/L (ref 14–54)
ANION GAP: 9 (ref 5–15)
AST: 30 U/L (ref 15–41)
Albumin: 4.5 g/dL (ref 3.5–5.0)
Alkaline Phosphatase: 79 U/L (ref 38–126)
BILIRUBIN TOTAL: 0.6 mg/dL (ref 0.3–1.2)
BUN: 11 mg/dL (ref 6–20)
CO2: 24 mmol/L (ref 22–32)
Calcium: 9.3 mg/dL (ref 8.9–10.3)
Chloride: 109 mmol/L (ref 101–111)
Creatinine, Ser: 0.88 mg/dL (ref 0.44–1.00)
GFR calc Af Amer: >60 mL/min (ref >60)
Glucose, Bld: 87 mg/dL (ref 65–99)
Potassium: 3.5 mmol/L (ref 3.5–5.1)
Sodium: 142 mmol/L (ref 135–145)
Total Protein: 7.2 g/dL (ref 6.5–8.1)

## 2015-11-21 MED ORDER — HYDROMORPHONE HCL 1 MG/ML IJ SOLN
0.5000 mg | Freq: Once | INTRAMUSCULAR | Status: AC
  Administered 2015-11-21: 0.5 mg via INTRAVENOUS
  Filled 2015-11-21: qty 1

## 2015-11-21 MED ORDER — ONDANSETRON HCL 4 MG/2ML IJ SOLN
4.0000 mg | Freq: Once | INTRAMUSCULAR | Status: AC
  Administered 2015-11-21: 4 mg via INTRAVENOUS
  Filled 2015-11-21: qty 2

## 2015-11-21 MED ORDER — TRAMADOL HCL 50 MG PO TABS: 50.0000 mg | ORAL_TABLET | Freq: Four times a day (QID) | ORAL | Status: DC | PRN

## 2015-11-21 MED ORDER — NAPROXEN 500 MG PO TABS: 500.0000 mg | ORAL_TABLET | Freq: Two times a day (BID) | ORAL | Status: DC

## 2015-11-21 NOTE — Discharge Instructions (Signed)
Follow up with your family md next week if not improving °

## 2015-11-21 NOTE — ED Provider Notes (Signed)
CSN: [id number]     Arrival date & time 11/21/15  1409 History   First MD Initiated Contact with Patient 11/21/15 1450     Chief Complaint  Patient presents with  . Flank Pain     (Consider location/radiation/quality/duration/timing/severity/associated sxs/prior Treatment) Patient is a 45 y.o. female presenting with flank pain. The history is provided by the patient (Patient complains of right flank pain for 1 week. She was seen at urgent care today the center  Thoughts significant kidney stone).  Flank Pain This is a new problem. The current episode started more than 2 days ago. The problem occurs constantly. The problem has not changed since onset.Pertinent negatives include no chest pain, no abdominal pain and no headaches. Nothing aggravates the symptoms. Nothing relieves the symptoms.    Past Medical History  Diagnosis Date  . Hypertension    Past Surgical History  Procedure Laterality Date  . Abdominal hysterectomy    . Appendectomy    . Neck surgery    . Hand surgery    . Cholecystectomy N/A 11/22/2012    Procedure: LAPAROSCOPIC CHOLECYSTECTOMY WITH INTRAOPERATIVE CHOLANGIOGRAM;  Surgeon: Shann Medal, MD;  Location: [facility];  Service: General;  Laterality: N/A;   History reviewed. No pertinent family history. Social History  Substance Use Topics  . Smoking status: Never Smoker   . Smokeless tobacco: Never Used  . Alcohol Use: No   OB History    No data available     Review of Systems  Constitutional: Negative for appetite change and fatigue.  HENT: Negative for congestion, ear discharge and sinus pressure.   Eyes: Negative for discharge.  Respiratory: Negative for cough.   Cardiovascular: Negative for chest pain.  Gastrointestinal: Negative for abdominal pain and diarrhea.  Genitourinary: Positive for flank pain. Negative for frequency and hematuria.  Musculoskeletal: Negative for back pain.  Skin: Negative for rash.  Neurological: Negative for seizures and  headaches.  Psychiatric/Behavioral: Negative for hallucinations.      Allergies  Codeine  Home Medications   Prior to Admission medications   Medication Sig Start Date End Date Taking? Authorizing Provider  Chlorpheniramine Maleate (ALLERGY PO) Take 1 tablet by mouth daily.   Yes Historical Provider, MD  lisinopril (PRINIVIL,ZESTRIL) 20 MG tablet Take 20 mg by mouth daily. 09/24/15  Yes Historical Provider, MD  Multiple Vitamins-Minerals (MULTIVITAMIN ADULT PO) Take 1 tablet by mouth daily.   Yes Historical Provider, MD  naproxen (NAPROSYN) 500 MG tablet Take 1 tablet (500 mg total) by mouth 2 (two) times daily. 11/21/15   Milton Ferguson, MD  traMADol (ULTRAM) 50 MG tablet Take 1 tablet (50 mg total) by mouth every 6 (six) hours as needed. 11/21/15   Milton Ferguson, MD   BP 132/84 mmHg  Pulse 72  Temp(Src) 98.3 F (36.8 C) (Oral)  Resp 16  SpO2 96% Physical Exam  Constitutional: She is oriented to person, place, and time. She appears well-developed.  HENT:  Head: Normocephalic.  Eyes: Conjunctivae and EOM are normal. No scleral icterus.  Neck: Neck supple. No thyromegaly present.  Cardiovascular: Normal rate and regular rhythm.  Exam reveals no gallop and no friction rub.   No murmur heard. Pulmonary/Chest: No stridor. She has no wheezes. She has no rales. She exhibits no tenderness.  Abdominal: She exhibits no distension. There is no tenderness. There is no rebound.  Genitourinary:  Moderate right flank tenderness. Slightly worse with movement  Musculoskeletal: Normal range of motion. She exhibits no edema.  Lymphadenopathy:  She has no cervical adenopathy.  Neurological: She is oriented to person, place, and time. She exhibits normal muscle tone. Coordination normal.  Skin: No rash noted. No erythema.  Psychiatric: She has a normal mood and affect. Her behavior is normal.    ED Course  Procedures (including critical care time) Labs Review Labs Reviewed  URINALYSIS,  ROUTINE W REFLEX MICROSCOPIC (NOT AT [facility]) - Abnormal; Notable for the following:    APPearance HAZY (*)    Specific Gravity, Urine 1.033 (*)    All other components within normal limits  CBC WITH DIFFERENTIAL/PLATELET  COMPREHENSIVE METABOLIC PANEL    Imaging Review Ct Renal Stone Study  11/21/2015  CLINICAL DATA:  Right flank pain. Previous hysterectomy and cholecystectomy. Previous appendectomy. EXAM: CT ABDOMEN AND PELVIS WITHOUT CONTRAST TECHNIQUE: Multidetector CT imaging of the abdomen and pelvis was performed following the standard protocol without IV contrast. COMPARISON:  Abdomen radiograph dated 10/04/2013. FINDINGS: Lower chest: Minimal linear atelectasis or scarring at both lung bases, greater on the left. Hepatobiliary: Normal appearing liver.  Cholecystectomy clips. Pancreas: No mass or inflammatory process identified on this un-enhanced exam. Spleen: Within normal limits in size and appearance. Adrenals/Urinary Tract: There are 2 tiny probable vascular calcifications at the anterior medial aspect of the right psoas muscle at the level of the upper pelvis on image number 53 of series 2. No definite urinary tract calculi are seen. No hydronephrosis. Normal appearing adrenal glands. Stomach/Bowel: No gastrointestinal abnormalities. Surgically absent appendix. Vascular/Lymphatic: No pathologically enlarged lymph nodes. No evidence of abdominal aortic aneurysm. Reproductive: Surgically absent uterus.  No adnexal masses. Other: Tiny umbilical hernia containing fat. Musculoskeletal: L2 vertebral hemangioma. Minimal lumbar and lower thoracic spine degenerative changes. IMPRESSION: 1. No acute abnormality. 2. 2 tiny calcifications at the level of the upper pelvis on the right. These are most likely vascular with no definite urinary tract calculi and no hydronephrosis seen. Electronically Signed   By: Claudie Revering M.D.   On: 11/21/2015 16:33   I have personally reviewed and evaluated these images  and lab results as part of my medical decision-making.   EKG Interpretation None      MDM   Final diagnoses:  Flank pain    CBC urinalysis chemistries unremarkable. CT renal negative. Suspect musculoskeletal discomfort will put patient on Naprosyn and Ultram she'll follow-up with her PCP    Milton Ferguson, MD 11/21/15 1807

## 2015-11-21 NOTE — ED Notes (Signed)
Pt from Urgent Care. Since 6/1 this month has been having worsening right flank pain. UA there tested positive for blood. Sent here for eval for possible kidney stones. Pt has lab results with her. States she has been experiencing urinary urgency

## 2016-02-23 ENCOUNTER — Emergency Department (HOSPITAL_COMMUNITY): Payer: BLUE CROSS/BLUE SHIELD

## 2016-02-23 ENCOUNTER — Emergency Department (HOSPITAL_COMMUNITY)
Admission: EM | Admit: 2016-02-23 | Discharge: 2016-02-23 | Disposition: A | Discharge status: Home / Self Care | Payer: BLUE CROSS/BLUE SHIELD | Attending: Emergency Medicine | Admitting: Emergency Medicine

## 2016-02-23 ENCOUNTER — Encounter (HOSPITAL_COMMUNITY): Payer: Self-pay | Admitting: Emergency Medicine

## 2016-02-23 DIAGNOSIS — M542 Cervicalgia: Secondary | ICD-10-CM | POA: Insufficient documentation

## 2016-02-23 DIAGNOSIS — I10 Essential (primary) hypertension: Secondary | ICD-10-CM | POA: Insufficient documentation

## 2016-02-23 DIAGNOSIS — R51 Headache: Secondary | ICD-10-CM | POA: Insufficient documentation

## 2016-02-23 DIAGNOSIS — H538 Other visual disturbances: Secondary | ICD-10-CM | POA: Diagnosis present

## 2016-02-23 DIAGNOSIS — Z79899 Other long term (current) drug therapy: Secondary | ICD-10-CM | POA: Insufficient documentation

## 2016-02-23 DIAGNOSIS — R519 Headache, unspecified: Secondary | ICD-10-CM

## 2016-02-23 LAB — CBC WITH DIFFERENTIAL/PLATELET
BASOS ABS: 0 10*3/uL (ref 0.0–0.1)
Basophils Relative: 0 %
Eosinophils Absolute: 0.1 10*3/uL (ref 0.0–0.7)
Eosinophils Relative: 2 %
HEMATOCRIT: 39.8 % (ref 36.0–46.0)
HEMOGLOBIN: 13 g/dL (ref 12.0–15.0)
LYMPHS PCT: 35 %
Lymphs Abs: 2.3 10*3/uL (ref 0.7–4.0)
MCH: 28.2 pg (ref 26.0–34.0)
MCHC: 32.7 g/dL (ref 30.0–36.0)
MCV: 86.3 fL (ref 78.0–100.0)
MONO ABS: 0.4 10*3/uL (ref 0.1–1.0)
Monocytes Relative: 6 %
NEUTROS ABS: 3.9 10*3/uL (ref 1.7–7.7)
Neutrophils Relative %: 57 %
Platelets: 260 10*3/uL (ref 150–400)
RBC: 4.61 MIL/uL (ref 3.87–5.11)
RDW: 13.7 % (ref 11.5–15.5)
WBC: 6.7 10*3/uL (ref 4.0–10.5)

## 2016-02-23 LAB — BASIC METABOLIC PANEL
ANION GAP: 6 (ref 5–15)
BUN: 11 mg/dL (ref 6–20)
CO2: 24 mmol/L (ref 22–32)
Calcium: 9.6 mg/dL (ref 8.9–10.3)
Chloride: 111 mmol/L (ref 101–111)
Creatinine, Ser: 0.79 mg/dL (ref 0.44–1.00)
GFR calc Af Amer: >60 mL/min (ref >60)
GLUCOSE: 97 mg/dL (ref 65–99)
POTASSIUM: 4 mmol/L (ref 3.5–5.1)
Sodium: 141 mmol/L (ref 135–145)

## 2016-02-23 MED ORDER — HYDROMORPHONE HCL 1 MG/ML IJ SOLN
1.0000 mg | Freq: Once | INTRAMUSCULAR | Status: AC
  Administered 2016-02-23: 1 mg via INTRAVENOUS
  Filled 2016-02-23: qty 1

## 2016-02-23 MED ORDER — SODIUM CHLORIDE 0.9 % IV BOLUS (SEPSIS)
1000.0000 mL | Freq: Once | INTRAVENOUS | Status: AC
  Administered 2016-02-23: 1000 mL via INTRAVENOUS

## 2016-02-23 MED ORDER — METHOCARBAMOL 500 MG PO TABS: 500.0000 mg | ORAL_TABLET | Freq: Two times a day (BID) | ORAL | 0 refills | Status: DC | PRN

## 2016-02-23 MED ORDER — NAPROXEN 250 MG PO TABS: 250.0000 mg | ORAL_TABLET | Freq: Two times a day (BID) | ORAL | 0 refills | Status: DC

## 2016-02-23 MED ORDER — ONDANSETRON HCL 4 MG/2ML IJ SOLN
4.0000 mg | Freq: Once | INTRAMUSCULAR | Status: AC
  Administered 2016-02-23: 4 mg via INTRAVENOUS
  Filled 2016-02-23: qty 2

## 2016-02-23 MED ORDER — METOCLOPRAMIDE HCL 5 MG/ML IJ SOLN
10.0000 mg | Freq: Once | INTRAMUSCULAR | Status: AC
  Administered 2016-02-23: 10 mg via INTRAVENOUS
  Filled 2016-02-23: qty 2

## 2016-02-23 NOTE — ED Provider Notes (Signed)
Patient care assumed from [facility], PA-C at shift change. Please see her note for further. Briefly, patient presented to the emergency department complaining of headache, right sided neck pain and a heavy feeling in her right side of her body. No focal neurological deficits on her exam. Plan at shift change is for CT of her head and neck. These are unremarkable plan is for discharge.  Blood work is unremarkable.  CT head and neck are unremarkable.  At recheck patient reports she is feeling better. She reports she feels a little jittery from the medications. She has good grip strengths bilaterally. She is good strength to her bilateral upper and lower extremities. She does have tenderness along her right trapezius muscle from her shoulder into her neck and head. Muscle pain vs radicular pain possibly. Will discharge with naproxen and Robaxin and have her follow-up with her primary care doctor and neurosurgeon. I discussed strict and specific return precautions. I advised the patient to follow-up with their primary care provider this week. I advised the patient to return to the emergency department with new or worsening symptoms or new concerns. The patient verbalized understanding and agreement with plan.       Results for orders placed or performed during the hospital encounter of [id number]  Basic metabolic panel  Result Value Ref Range   Sodium 141 135 - 145 mmol/L   Potassium 4.0 3.5 - 5.1 mmol/L   Chloride 111 101 - 111 mmol/L   CO2 24 22 - 32 mmol/L   Glucose, Bld 97 65 - 99 mg/dL   BUN 11 6 - 20 mg/dL   Creatinine, Ser 0.79 0.44 - 1.00 mg/dL   Calcium 9.6 8.9 - 10.3 mg/dL   GFR calc non Af Amer >60 >60 mL/min   GFR calc Af Amer >60 >60 mL/min   Anion gap 6 5 - 15  CBC with Differential  Result Value Ref Range   WBC 6.7 4.0 - 10.5 K/uL   RBC 4.61 3.87 - 5.11 MIL/uL   Hemoglobin 13.0 12.0 - 15.0 g/dL   HCT 39.8 36.0 - 46.0 %   MCV 86.3 78.0 - 100.0 fL   MCH 28.2 26.0 - 34.0 pg   MCHC 32.7 30.0 - 36.0 g/dL   RDW 13.7 11.5 - 15.5 %   Platelets 260 150 - 400 K/uL   Neutrophils Relative % 57 %   Neutro Abs 3.9 1.7 - 7.7 K/uL   Lymphocytes Relative 35 %   Lymphs Abs 2.3 0.7 - 4.0 K/uL   Monocytes Relative 6 %   Monocytes Absolute 0.4 0.1 - 1.0 K/uL   Eosinophils Relative 2 %   Eosinophils Absolute 0.1 0.0 - 0.7 K/uL   Basophils Relative 0 %   Basophils Absolute 0.0 0.0 - 0.1 K/uL   Ct Head Wo Contrast  Result Date: 02/23/2016 CLINICAL DATA:  Headache, right upper and lower extremity paresthesias. Onset early morning. No trauma. EXAM: CT HEAD WITHOUT CONTRAST CT CERVICAL SPINE WITHOUT CONTRAST TECHNIQUE: Multidetector CT imaging of the head and cervical spine was performed following the standard protocol without intravenous contrast. Multiplanar CT image reconstructions of the cervical spine were also generated. COMPARISON:  None. FINDINGS: CT HEAD FINDINGS Brain: No evidence of acute infarction, hemorrhage, hydrocephalus, extra-axial collection or mass lesion/mass effect. Vascular: No hyperdense vessel or unexpected calcification. Skull: Normal. Negative for fracture or focal lesion. Sinuses/Orbits: No acute finding. CT CERVICAL SPINE FINDINGS Alignment: Mild reversal of cervical lordosis. Skull base and vertebrae: Solidly ossified interbody  fusion at C5-6. No fracture or other acute bony abnormality. No bone lesion or bony destruction. Soft tissues and spinal canal: No prevertebral fluid or swelling. No visible canal hematoma. Disc levels: Mild degenerative disc changes at C3-4 and C5-6. Facet articulations are well-preserved. Upper chest: No significant abnormality Other: IMPRESSION: 1. Normal brain 2. Solidly ossified interbody fusion at C5-6. Otherwise unremarkable cervical spine. Electronically Signed   By: Andreas Newport M.D.   On: 02/23/2016 22:01   Ct Cervical Spine Wo Contrast  Result Date: 02/23/2016 CLINICAL DATA:  Headache, right upper and lower extremity  paresthesias. Onset early morning. No trauma. EXAM: CT HEAD WITHOUT CONTRAST CT CERVICAL SPINE WITHOUT CONTRAST TECHNIQUE: Multidetector CT imaging of the head and cervical spine was performed following the standard protocol without intravenous contrast. Multiplanar CT image reconstructions of the cervical spine were also generated. COMPARISON:  None. FINDINGS: CT HEAD FINDINGS Brain: No evidence of acute infarction, hemorrhage, hydrocephalus, extra-axial collection or mass lesion/mass effect. Vascular: No hyperdense vessel or unexpected calcification. Skull: Normal. Negative for fracture or focal lesion. Sinuses/Orbits: No acute finding. CT CERVICAL SPINE FINDINGS Alignment: Mild reversal of cervical lordosis. Skull base and vertebrae: Solidly ossified interbody fusion at C5-6. No fracture or other acute bony abnormality. No bone lesion or bony destruction. Soft tissues and spinal canal: No prevertebral fluid or swelling. No visible canal hematoma. Disc levels: Mild degenerative disc changes at C3-4 and C5-6. Facet articulations are well-preserved. Upper chest: No significant abnormality Other: IMPRESSION: 1. Normal brain 2. Solidly ossified interbody fusion at C5-6. Otherwise unremarkable cervical spine. Electronically Signed   By: Andreas Newport M.D.   On: 02/23/2016 22:01       Waynetta Pean, PA-C 02/23/16 2224    Lacretia Leigh, MD 02/27/16 [phone]

## 2016-02-23 NOTE — ED Provider Notes (Signed)
[facility] DEPT Provider Note   CSN: [id number] Arrival date & time: 02/23/16  1825     History   Chief Complaint Chief Complaint  Patient presents with  . Arm Pain  . Blurred Vision    HPI Amber Cuevas is a 45 y.o. female.  HPI   Pt with hx HTN, remote repair of ruptured and bulging discs of cervical spine, presents with headache, right neck pain, right arm and leg pain, and right sided heaviness.  States she has had intermittent headaches x 2 weeks.  Treated with ibuprofen. The pain is in different places, has been sharp and stabbing, comes and goes randomly, eased up prior to coming to ED.  Right sided sharp neck pain began at 2am waking her from sleep.  The pain has progressively travelled down her right arm and leg.  It is painful to move the extremities and it feels like there is a "weight" preventing her from raising her right shoulder.  Associated intermittent blurry vision since noon today.  Denies fevers, recent illness, neck stiffness, head injury, trauma, heavy lifting.   Pt unsure of cervical spine surgeon. PCP Cammie Fulp.   Past Medical History:  Diagnosis Date  . Hypertension     Patient Active Problem List   Diagnosis Date Noted  . Lipoma of back 09/19/2013  . Gallstones 10/30/2012    Past Surgical History:  Procedure Laterality Date  . ABDOMINAL HYSTERECTOMY    . APPENDECTOMY    . CHOLECYSTECTOMY N/A 11/22/2012   Procedure: LAPAROSCOPIC CHOLECYSTECTOMY WITH INTRAOPERATIVE CHOLANGIOGRAM;  Surgeon: Shann Medal, MD;  Location: [facility];  Service: General;  Laterality: N/A;  . HAND SURGERY    . NECK SURGERY      OB History    No data available       Home Medications    Prior to Admission medications   Medication Sig Start Date End Date Taking? Authorizing Provider  Chlorpheniramine Maleate (ALLERGY PO) Take 1 tablet by mouth daily.    Historical Provider, MD  lisinopril (PRINIVIL,ZESTRIL) 20 MG tablet Take 20 mg by mouth daily.  09/24/15   Historical Provider, MD  Multiple Vitamins-Minerals (MULTIVITAMIN ADULT PO) Take 1 tablet by mouth daily.    Historical Provider, MD  naproxen (NAPROSYN) 500 MG tablet Take 1 tablet (500 mg total) by mouth 2 (two) times daily. 11/21/15   Milton Ferguson, MD  traMADol (ULTRAM) 50 MG tablet Take 1 tablet (50 mg total) by mouth every 6 (six) hours as needed. 11/21/15   Milton Ferguson, MD    Family History History reviewed. No pertinent family history.  Social History Social History  Substance Use Topics  . Smoking status: Never Smoker  . Smokeless tobacco: Never Used  . Alcohol use No     Allergies   Codeine   Review of Systems Review of Systems  All other systems reviewed and are negative.    Physical Exam Updated Vital Signs BP 123/94 (BP Location: Left Arm)   Pulse 72   Temp 98.1 F (36.7 C) (Oral)   Resp 18   SpO2 97%   Physical Exam  Constitutional: She appears well-developed and well-nourished. No distress.  HENT:  Head: Normocephalic and atraumatic.  Neck: Neck supple.  Cardiovascular: Normal rate and regular rhythm.   Pulmonary/Chest: Effort normal and breath sounds normal. No respiratory distress. She has no wheezes. She has no rales.  Abdominal: Soft. She exhibits no distension. There is no tenderness. There is no rebound and no guarding.  Musculoskeletal:       Back:  Neurological: She is alert.  Skin: She is not diaphoretic.  Psychiatric:  CN II-XII intact, EOMs intact, no pronator drift, grip strengths equal bilaterally; strength 5/5 in all extremities though pt does not abduct right shoulder above 40 degrees secondary to pain, sensation intact in all extremities; finger to nose, heel to shin, rapid alternating movements normal; gait is limping.     Nursing note and vitals reviewed.    ED Treatments / Results  Labs (all labs ordered are listed, but only abnormal results are displayed) Labs Reviewed  BASIC METABOLIC PANEL  CBC WITH  DIFFERENTIAL/PLATELET    EKG  EKG Interpretation None       Radiology No results found.  Procedures Procedures (including critical care time)  Medications Ordered in ED Medications  metoCLOPramide (REGLAN) injection 10 mg (not administered)  HYDROmorphone (DILAUDID) injection 1 mg (1 mg Intravenous Given 02/23/16 2021)  sodium chloride 0.9 % bolus 1,000 mL (1,000 mLs Intravenous New Bag/Given 02/23/16 2024)     Initial Impression / Assessment and Plan / ED Course  I have reviewed the triage vital signs and the nursing notes.  Pertinent labs & imaging results that were available during my care of the patient were reviewed by me and considered in my medical decision making (see chart for details).  Clinical Course  Comment By Time  Pt ambulated to bathroom with assistance, able to ambulate on her own.  Holds right arm and limping gait.  Clayton Bibles, PA-C 09/12 1937    Afebrile, nontoxic patient with intermittent headaches x 2 weeks, right neck pain and right upper and lower extremity pain since this morning.  No weakness on exam.  Headache is improved prior to being seen.  No red flags for headache.  Doubt intracranial bleed. Doubt acute cord compression.  Discussed pt and workup with Dr Roderic Palau.  CT head, c-spine, basic labs, IVF, pain medications ordered.  Pt signed out to Will Dansie, PA-C, at change of shift pending remainder of workup and reassessment.     Final Clinical Impressions(s) / ED Diagnoses   Final diagnoses:  Nonintractable episodic headache, unspecified headache type  Neck pain on right side    New Prescriptions New Prescriptions   No medications on file     Clayton Bibles, PA-C 02/23/16 2052    Milton Ferguson, MD 02/23/16 2321

## 2016-02-23 NOTE — ED Triage Notes (Addendum)
Pt states that she has had neck pain and R arm pain since early this morning and it feels 'heavy' to lift her R arm and R leg. Also reports blurry vision. Alert and oriented. R grip weaker than L.

## 2016-02-23 NOTE — Discharge Instructions (Signed)
Read the information below.  Use the prescribed medication as directed.  Please discuss all new medications with your pharmacist.  You may return to the Emergency Department at any time for worsening condition or any new symptoms that concern you.    If you develop uncontrolled pain, weakness or numbness of the extremity, severe discoloration of the skin, or you are unable to walk or move your arm, return to the ER for a recheck.

## 2016-04-12 ENCOUNTER — Other Ambulatory Visit: Payer: Self-pay | Admitting: Neurological Surgery

## 2016-04-19 ENCOUNTER — Encounter (HOSPITAL_COMMUNITY)
Admission: RE | Admit: 2016-04-19 | Discharge: 2016-04-19 | Disposition: A | Discharge status: Home / Self Care | Payer: BLUE CROSS/BLUE SHIELD | Source: Ambulatory Visit | Attending: Neurological Surgery | Admitting: Neurological Surgery

## 2016-04-19 ENCOUNTER — Encounter (HOSPITAL_COMMUNITY): Payer: Self-pay

## 2016-04-19 ENCOUNTER — Ambulatory Visit (HOSPITAL_COMMUNITY)
Admission: RE | Admit: 2016-04-19 | Discharge: 2016-04-19 | Disposition: A | Discharge status: Home / Self Care | Payer: BLUE CROSS/BLUE SHIELD | Source: Ambulatory Visit | Attending: Neurological Surgery | Admitting: Neurological Surgery

## 2016-04-19 DIAGNOSIS — Z01818 Encounter for other preprocedural examination: Secondary | ICD-10-CM | POA: Diagnosis present

## 2016-04-19 DIAGNOSIS — M4802 Spinal stenosis, cervical region: Secondary | ICD-10-CM | POA: Insufficient documentation

## 2016-04-19 LAB — BASIC METABOLIC PANEL
Anion gap: 7 (ref 5–15)
BUN: 8 mg/dL (ref 6–20)
CALCIUM: 9.8 mg/dL (ref 8.9–10.3)
CHLORIDE: 106 mmol/L (ref 101–111)
CO2: 27 mmol/L (ref 22–32)
CREATININE: 0.8 mg/dL (ref 0.44–1.00)
GFR calc Af Amer: >60 mL/min (ref >60)
GFR calc non Af Amer: >60 mL/min (ref >60)
Glucose, Bld: 83 mg/dL (ref 65–99)
Potassium: 4 mmol/L (ref 3.5–5.1)
SODIUM: 140 mmol/L (ref 135–145)

## 2016-04-19 LAB — CBC WITH DIFFERENTIAL/PLATELET
Basophils Absolute: 0 10*3/uL (ref 0.0–0.1)
Basophils Relative: 0 %
EOS ABS: 0.1 10*3/uL (ref 0.0–0.7)
EOS PCT: 1 %
HCT: 41.7 % (ref 36.0–46.0)
Hemoglobin: 13.6 g/dL (ref 12.0–15.0)
LYMPHS ABS: 2 10*3/uL (ref 0.7–4.0)
Lymphocytes Relative: 29 %
MCH: 27.9 pg (ref 26.0–34.0)
MCHC: 32.6 g/dL (ref 30.0–36.0)
MCV: 85.5 fL (ref 78.0–100.0)
MONOS PCT: 4 %
Monocytes Absolute: 0.3 10*3/uL (ref 0.1–1.0)
Neutro Abs: 4.6 10*3/uL (ref 1.7–7.7)
Neutrophils Relative %: 66 %
PLATELETS: 256 10*3/uL (ref 150–400)
RBC: 4.88 MIL/uL (ref 3.87–5.11)
RDW: 13.2 % (ref 11.5–15.5)
WBC: 7 10*3/uL (ref 4.0–10.5)

## 2016-04-19 LAB — PROTIME-INR
INR: 0.99
PROTHROMBIN TIME: 13.1 s (ref 11.4–15.2)

## 2016-04-19 LAB — SURGICAL PCR SCREEN
MRSA, PCR: NEGATIVE
Staphylococcus aureus: NEGATIVE

## 2016-04-19 NOTE — Progress Notes (Signed)
PCP is Dr Antony Blackbird Denies ever seeing a cardiologist. Denies ever having a stress test, echo, or card cath. EKG noted  02-23-16 Denies chest pain.

## 2016-04-19 NOTE — Pre-Procedure Instructions (Signed)
ALISSON DINEEN  04/19/2016      [facility], [place] [address] [place] [address] Phone: [phone] Fax: [phone]    Your procedure is scheduled on Nov 10  Report to [facility] at 530 A.M.  Call this number if you have problems the morning of surgery:  [phone]   Remember:  Do not eat food or drink liquids after midnight.  Take these medicines the morning of surgery with A SIP OF WATER  NA   Stop taking aspirin, Ibuprofen, Advil, Motrin, Aleve, BC's, Goody's, herbal medications, Fish Oil, Vitamins, Naproxen, Aleve   Do not wear jewelry, make-up or nail polish.  Do not wear lotions, powders, or perfumes, or deoderant.  Do not shave 48 hours prior to surgery.  Men may shave face and neck.  Do not bring valuables to the hospital.  [facility] is not responsible for any belongings or valuables.  Contacts, dentures or bridgework may not be worn into surgery.  Leave your suitcase in the car.  After surgery it may be brought to your room.  For patients admitted to the hospital, discharge time will be determined by your treatment team.  Patients discharged the day of surgery will not be allowed to drive home.   Special instructions:  [facility] - Preparing for Surgery  Before surgery, you can play an important role.  Because skin is not sterile, your skin needs to be as free of germs as possible.  You can reduce the number of germs on you skin by washing with CHG (chlorahexidine gluconate) soap before surgery.  CHG is an antiseptic cleaner which kills germs and bonds with the skin to continue killing germs even after washing.  Please DO NOT use if you have an allergy to CHG or antibacterial soaps.  If your skin becomes reddened/irritated stop using the CHG and inform your nurse when you arrive at Short Stay.  Do not shave (including legs and underarms) for at least 48  hours prior to the first CHG shower.  You may shave your face.  Please follow these instructions carefully:   1.  Shower with CHG Soap the night before surgery and the   morning of Surgery.  2.  If you choose to wash your hair, wash your hair first as usual with your  normal shampoo.  3.  After you shampoo, rinse your hair and body thoroughly to remove the  Shampoo.  4.  Use CHG as you would any other liquid soap.  You can apply chg directly   to the skin and wash gently with scrungie or a clean washcloth.  5.  Apply the CHG Soap to your body ONLY FROM THE NECK DOWN.   Do not use on open wounds or open sores.  Avoid contact with your eyes,   ears, mouth and genitals (private parts).  Wash genitals (private parts)   with your normal soap.  6.  Wash thoroughly, paying special attention to the area where your surgery  will be performed.  7.  Thoroughly rinse your body with warm water from the neck down.  8.  DO NOT shower/wash with your normal soap after using and rinsing off the CHG Soap.  9.  Pat yourself dry with a clean towel.            10.  Wear clean pajamas.  11.  Place clean sheets on your bed the night of your first shower and do not sleep with pets.  Day of Surgery  Do not apply any lotions/deoderants the morning of surgery.  Please wear clean clothes to the hospital/surgery center.     Please read over the following fact sheets that you were given. Pain Booklet, Coughing and Deep Breathing, MRSA Information and Surgical Site Infection Prevention

## 2016-04-21 NOTE — Anesthesia Preprocedure Evaluation (Addendum)
Anesthesia Evaluation  Patient identified by MRN, date of birth, ID band Patient awake    Reviewed: Allergy & Precautions, H&P , NPO status , Patient's Chart, lab work & pertinent test results  History of Anesthesia Complications Negative for: history of anesthetic complications  Airway Mallampati: II  TM Distance: >3 FB Neck ROM: Full    Dental no notable dental hx. (+) Dental Advisory Given   Pulmonary neg pulmonary ROS,    Pulmonary exam normal        Cardiovascular hypertension, Pt. on medications negative cardio ROS Normal cardiovascular exam     Neuro/Psych negative psych ROS   GI/Hepatic negative GI ROS, Neg liver ROS,   Endo/Other  negative endocrine ROS  Renal/GU negative Renal ROS  negative genitourinary   Musculoskeletal negative musculoskeletal ROS (+)   Abdominal   Peds negative pediatric ROS (+)  Hematology negative hematology ROS (+)   Anesthesia Other Findings   Reproductive/Obstetrics negative OB ROS                            Anesthesia Physical  Anesthesia Plan  ASA: II  Anesthesia Plan: General   Post-op Pain Management:    Induction: Intravenous  Airway Management Planned: Oral ETT  Additional Equipment:   Intra-op Plan:   Post-operative Plan: Extubation in OR  Informed Consent:   Dental advisory given  Plan Discussed with: CRNA, Anesthesiologist and Surgeon  Anesthesia Plan Comments:       Anesthesia Quick Evaluation

## 2016-04-22 ENCOUNTER — Ambulatory Visit (HOSPITAL_COMMUNITY): Payer: BLUE CROSS/BLUE SHIELD | Admitting: Anesthesiology

## 2016-04-22 ENCOUNTER — Ambulatory Visit (HOSPITAL_COMMUNITY)
Admission: RE | Admit: 2016-04-22 | Discharge: 2016-04-23 | Disposition: A | Discharge status: Home / Self Care | Payer: BLUE CROSS/BLUE SHIELD | Source: Ambulatory Visit | Attending: Neurological Surgery | Admitting: Neurological Surgery

## 2016-04-22 ENCOUNTER — Ambulatory Visit (HOSPITAL_COMMUNITY): Payer: BLUE CROSS/BLUE SHIELD

## 2016-04-22 ENCOUNTER — Encounter (HOSPITAL_COMMUNITY)
Admission: RE | Disposition: A | Discharge status: Home / Self Care | Payer: Self-pay | Source: Ambulatory Visit | Attending: Neurological Surgery

## 2016-04-22 ENCOUNTER — Encounter (HOSPITAL_COMMUNITY): Payer: Self-pay | Admitting: *Deleted

## 2016-04-22 DIAGNOSIS — M50121 Cervical disc disorder at C4-C5 level with radiculopathy: Secondary | ICD-10-CM | POA: Diagnosis present

## 2016-04-22 DIAGNOSIS — I10 Essential (primary) hypertension: Secondary | ICD-10-CM | POA: Diagnosis not present

## 2016-04-22 DIAGNOSIS — Z419 Encounter for procedure for purposes other than remedying health state, unspecified: Secondary | ICD-10-CM

## 2016-04-22 DIAGNOSIS — Z981 Arthrodesis status: Secondary | ICD-10-CM

## 2016-04-22 HISTORY — PX: ANTERIOR CERVICAL DECOMP/DISCECTOMY FUSION: SHX1161

## 2016-04-22 SURGERY — ANTERIOR CERVICAL DECOMPRESSION/DISCECTOMY FUSION 1 LEVEL
Anesthesia: General

## 2016-04-22 MED ORDER — ROCURONIUM BROMIDE 100 MG/10ML IV SOLN
INTRAVENOUS | Status: DC | PRN
  Administered 2016-04-22: 70 mg via INTRAVENOUS

## 2016-04-22 MED ORDER — POTASSIUM CHLORIDE IN NACL 20-0.9 MEQ/L-% IV SOLN: INTRAVENOUS | Status: DC

## 2016-04-22 MED ORDER — SODIUM CHLORIDE 0.9% FLUSH: 3.0000 mL | INTRAVENOUS | Status: DC | PRN

## 2016-04-22 MED ORDER — MENTHOL 3 MG MT LOZG: 1.0000 | LOZENGE | OROMUCOSAL | Status: DC | PRN

## 2016-04-22 MED ORDER — METHYLPREDNISOLONE ACETATE 80 MG/ML IJ SUSP
INTRAMUSCULAR | Status: AC
  Filled 2016-04-22: qty 1

## 2016-04-22 MED ORDER — SUGAMMADEX SODIUM 200 MG/2ML IV SOLN
INTRAVENOUS | Status: DC | PRN
  Administered 2016-04-22: 200 mg via INTRAVENOUS

## 2016-04-22 MED ORDER — CEFAZOLIN SODIUM-DEXTROSE 2-4 GM/100ML-% IV SOLN
2.0000 g | INTRAVENOUS | Status: AC
  Administered 2016-04-22: 2 g via INTRAVENOUS
  Filled 2016-04-22: qty 100

## 2016-04-22 MED ORDER — MORPHINE SULFATE (PF) 4 MG/ML IV SOLN
1.0000 mg | INTRAVENOUS | Status: DC | PRN
  Administered 2016-04-22 (×2): 4 mg via INTRAVENOUS
  Filled 2016-04-22 (×2): qty 1

## 2016-04-22 MED ORDER — SUCCINYLCHOLINE CHLORIDE 200 MG/10ML IV SOSY
PREFILLED_SYRINGE | INTRAVENOUS | Status: AC
  Filled 2016-04-22: qty 10

## 2016-04-22 MED ORDER — BUPIVACAINE HCL (PF) 0.25 % IJ SOLN
INTRAMUSCULAR | Status: AC
  Filled 2016-04-22: qty 30

## 2016-04-22 MED ORDER — ACETAMINOPHEN 650 MG RE SUPP: 650.0000 mg | RECTAL | Status: DC | PRN

## 2016-04-22 MED ORDER — PROMETHAZINE HCL 25 MG/ML IJ SOLN: 6.2500 mg | INTRAMUSCULAR | Status: DC | PRN

## 2016-04-22 MED ORDER — FENTANYL CITRATE (PF) 100 MCG/2ML IJ SOLN
INTRAMUSCULAR | Status: DC | PRN
  Administered 2016-04-22: 150 ug via INTRAVENOUS
  Administered 2016-04-22: 50 ug via INTRAVENOUS

## 2016-04-22 MED ORDER — LISINOPRIL 20 MG PO TABS: 20.0000 mg | ORAL_TABLET | Freq: Every day | ORAL | Status: DC

## 2016-04-22 MED ORDER — PHENOL 1.4 % MT LIQD
1.0000 | OROMUCOSAL | Status: DC | PRN
  Filled 2016-04-22: qty 177

## 2016-04-22 MED ORDER — METHOCARBAMOL 500 MG PO TABS
500.0000 mg | ORAL_TABLET | Freq: Four times a day (QID) | ORAL | Status: DC | PRN
  Administered 2016-04-22 – 2016-04-23 (×2): 500 mg via ORAL
  Filled 2016-04-22 (×2): qty 1

## 2016-04-22 MED ORDER — 0.9 % SODIUM CHLORIDE (POUR BTL) OPTIME
TOPICAL | Status: DC | PRN
  Administered 2016-04-22: 1000 mL

## 2016-04-22 MED ORDER — LACTATED RINGERS IV SOLN
INTRAVENOUS | Status: DC | PRN
  Administered 2016-04-22 (×2): via INTRAVENOUS

## 2016-04-22 MED ORDER — THROMBIN 5000 UNITS EX SOLR
CUTANEOUS | Status: DC | PRN
  Administered 2016-04-22 (×2): 5000 [IU] via TOPICAL

## 2016-04-22 MED ORDER — ACETAMINOPHEN 325 MG PO TABS: 650.0000 mg | ORAL_TABLET | ORAL | Status: DC | PRN

## 2016-04-22 MED ORDER — ONDANSETRON HCL 4 MG/2ML IJ SOLN
INTRAMUSCULAR | Status: AC
  Filled 2016-04-22: qty 2

## 2016-04-22 MED ORDER — ONDANSETRON HCL 4 MG/2ML IJ SOLN
INTRAMUSCULAR | Status: DC | PRN
  Administered 2016-04-22: 4 mg via INTRAVENOUS

## 2016-04-22 MED ORDER — HYDROMORPHONE HCL 1 MG/ML IJ SOLN
0.2500 mg | INTRAMUSCULAR | Status: DC | PRN
  Administered 2016-04-22: 0.5 mg via INTRAVENOUS

## 2016-04-22 MED ORDER — LIDOCAINE HCL (CARDIAC) 20 MG/ML IV SOLN
INTRAVENOUS | Status: DC | PRN
  Administered 2016-04-22: 100 mg via INTRAVENOUS

## 2016-04-22 MED ORDER — CEFAZOLIN IN D5W 1 GM/50ML IV SOLN
1.0000 g | Freq: Three times a day (TID) | INTRAVENOUS | Status: AC
  Administered 2016-04-22 (×2): 1 g via INTRAVENOUS
  Filled 2016-04-22 (×2): qty 50

## 2016-04-22 MED ORDER — HYDROMORPHONE HCL 1 MG/ML IJ SOLN
INTRAMUSCULAR | Status: AC
  Filled 2016-04-22: qty 0.5

## 2016-04-22 MED ORDER — THROMBIN 5000 UNITS EX SOLR
CUTANEOUS | Status: AC
  Filled 2016-04-22: qty 15000

## 2016-04-22 MED ORDER — SODIUM CHLORIDE 0.9% FLUSH
3.0000 mL | Freq: Two times a day (BID) | INTRAVENOUS | Status: DC
  Administered 2016-04-22: 3 mL via INTRAVENOUS

## 2016-04-22 MED ORDER — OXYCODONE-ACETAMINOPHEN 5-325 MG PO TABS
1.0000 | ORAL_TABLET | ORAL | Status: DC | PRN
  Administered 2016-04-22 – 2016-04-23 (×2): 2 via ORAL
  Filled 2016-04-22 (×2): qty 2

## 2016-04-22 MED ORDER — HEMOSTATIC AGENTS (NO CHARGE) OPTIME
TOPICAL | Status: DC | PRN
  Administered 2016-04-22: 1 via TOPICAL

## 2016-04-22 MED ORDER — METHYLPREDNISOLONE ACETATE 80 MG/ML IJ SUSP
INTRAMUSCULAR | Status: DC | PRN
  Administered 2016-04-22: 80 mg

## 2016-04-22 MED ORDER — FENTANYL CITRATE (PF) 100 MCG/2ML IJ SOLN
INTRAMUSCULAR | Status: AC
  Filled 2016-04-22: qty 4

## 2016-04-22 MED ORDER — DIAZEPAM 5 MG/ML IJ SOLN
2.5000 mg | Freq: Once | INTRAMUSCULAR | Status: AC
  Administered 2016-04-22: 2.5 mg via INTRAVENOUS

## 2016-04-22 MED ORDER — THROMBIN 5000 UNITS EX SOLR
CUTANEOUS | Status: DC | PRN
  Administered 2016-04-22: 09:00:00 via TOPICAL

## 2016-04-22 MED ORDER — CHLORHEXIDINE GLUCONATE CLOTH 2 % EX PADS: 6.0000 | MEDICATED_PAD | Freq: Once | CUTANEOUS | Status: DC

## 2016-04-22 MED ORDER — DIAZEPAM 5 MG/ML IJ SOLN
INTRAMUSCULAR | Status: AC
  Filled 2016-04-22: qty 2

## 2016-04-22 MED ORDER — EPHEDRINE 5 MG/ML INJ
INTRAVENOUS | Status: AC
  Filled 2016-04-22: qty 10

## 2016-04-22 MED ORDER — ONDANSETRON HCL 4 MG/2ML IJ SOLN
4.0000 mg | INTRAMUSCULAR | Status: DC | PRN
Start: 2016-04-22 — End: 2016-04-23
  Administered 2016-04-22: 4 mg via INTRAVENOUS
  Filled 2016-04-22: qty 2

## 2016-04-22 MED ORDER — SCOPOLAMINE 1 MG/3DAYS TD PT72
1.0000 | MEDICATED_PATCH | Freq: Once | TRANSDERMAL | Status: DC
  Administered 2016-04-22: 1.5 mg via TRANSDERMAL
  Filled 2016-04-22: qty 1

## 2016-04-22 MED ORDER — SODIUM CHLORIDE 0.9 % IR SOLN
Status: DC | PRN
  Administered 2016-04-22: 09:00:00

## 2016-04-22 MED ORDER — METHOCARBAMOL 1000 MG/10ML IJ SOLN
500.0000 mg | Freq: Four times a day (QID) | INTRAVENOUS | Status: DC | PRN
  Filled 2016-04-22: qty 5

## 2016-04-22 MED ORDER — DEXAMETHASONE SODIUM PHOSPHATE 10 MG/ML IJ SOLN
10.0000 mg | INTRAMUSCULAR | Status: AC
  Administered 2016-04-22: 10 mg via INTRAVENOUS
  Filled 2016-04-22: qty 1

## 2016-04-22 MED ORDER — PROPOFOL 10 MG/ML IV BOLUS
INTRAVENOUS | Status: AC
  Filled 2016-04-22: qty 20

## 2016-04-22 MED ORDER — PROMETHAZINE HCL 25 MG/ML IJ SOLN
25.0000 mg | Freq: Once | INTRAMUSCULAR | Status: AC
  Administered 2016-04-22: 25 mg via INTRAMUSCULAR
  Filled 2016-04-22: qty 1

## 2016-04-22 MED ORDER — BUPIVACAINE HCL (PF) 0.25 % IJ SOLN
INTRAMUSCULAR | Status: DC | PRN
  Administered 2016-04-22: 5 mL

## 2016-04-22 MED ORDER — MIDAZOLAM HCL 2 MG/2ML IJ SOLN
INTRAMUSCULAR | Status: AC
  Filled 2016-04-22: qty 2

## 2016-04-22 MED ORDER — PHENYLEPHRINE HCL 10 MG/ML IJ SOLN
INTRAVENOUS | Status: DC | PRN
  Administered 2016-04-22: 40 ug/min via INTRAVENOUS

## 2016-04-22 MED ORDER — MIDAZOLAM HCL 5 MG/5ML IJ SOLN
INTRAMUSCULAR | Status: DC | PRN
  Administered 2016-04-22: 2 mg via INTRAVENOUS

## 2016-04-22 MED ORDER — PROPOFOL 10 MG/ML IV BOLUS
INTRAVENOUS | Status: DC | PRN
  Administered 2016-04-22: 200 mg via INTRAVENOUS

## 2016-04-22 SURGICAL SUPPLY — 51 items
BAG DECANTER FOR FLEXI CONT (MISCELLANEOUS) ×3 IMPLANT
BASKET BONE COLLECTION (BASKET) ×3 IMPLANT
BENZOIN TINCTURE PRP APPL 2/3 (GAUZE/BANDAGES/DRESSINGS) ×3 IMPLANT
BIT DRILL POWER (BIT) ×1 IMPLANT
BUR MATCHSTICK NEURO 3.0 LAGG (BURR) ×3 IMPLANT
CAGE SMALL 7X13X15 (Cage) ×3 IMPLANT
CANISTER SUCT 3000ML PPV (MISCELLANEOUS) ×3 IMPLANT
CLIP TI MEDIUM 6 (CLIP) ×3 IMPLANT
CLOSURE WOUND 1/2 X4 (GAUZE/BANDAGES/DRESSINGS) ×1
DERMABOND ADVANCED (GAUZE/BANDAGES/DRESSINGS)
DERMABOND ADVANCED .7 DNX12 (GAUZE/BANDAGES/DRESSINGS) IMPLANT
DRAPE C-ARM 42X72 X-RAY (DRAPES) ×6 IMPLANT
DRAPE LAPAROTOMY 100X72 PEDS (DRAPES) ×3 IMPLANT
DRAPE MICROSCOPE LEICA (MISCELLANEOUS) ×3 IMPLANT
DRAPE POUCH INSTRU U-SHP 10X18 (DRAPES) ×3 IMPLANT
DRILL BIT POWER (BIT) ×2
DRSG OPSITE 4X5.5 SM (GAUZE/BANDAGES/DRESSINGS) ×3 IMPLANT
DRSG OPSITE POSTOP 3X4 (GAUZE/BANDAGES/DRESSINGS) ×3 IMPLANT
DURAPREP 6ML APPLICATOR 50/CS (WOUND CARE) ×3 IMPLANT
ELECT COATED BLADE 2.86 ST (ELECTRODE) ×3 IMPLANT
ELECT REM PT RETURN 9FT ADLT (ELECTROSURGICAL) ×3
ELECTRODE REM PT RTRN 9FT ADLT (ELECTROSURGICAL) ×1 IMPLANT
GAUZE SPONGE 4X4 16PLY XRAY LF (GAUZE/BANDAGES/DRESSINGS) IMPLANT
GLOVE BIO SURGEON STRL SZ8 (GLOVE) ×3 IMPLANT
GOWN STRL REUS W/ TWL LRG LVL3 (GOWN DISPOSABLE) IMPLANT
GOWN STRL REUS W/ TWL XL LVL3 (GOWN DISPOSABLE) IMPLANT
GOWN STRL REUS W/TWL 2XL LVL3 (GOWN DISPOSABLE) ×3 IMPLANT
GOWN STRL REUS W/TWL LRG LVL3 (GOWN DISPOSABLE)
GOWN STRL REUS W/TWL XL LVL3 (GOWN DISPOSABLE)
GRAFT DURAGEN MATRIX 1WX1L (Tissue) ×3 IMPLANT
HEMOSTAT POWDER KIT SURGIFOAM (HEMOSTASIS) ×3 IMPLANT
KIT BASIN OR (CUSTOM PROCEDURE TRAY) ×3 IMPLANT
KIT ROOM TURNOVER OR (KITS) ×3 IMPLANT
NEEDLE HYPO 18GX1.5 BLUNT FILL (NEEDLE) ×3 IMPLANT
NEEDLE HYPO 25X1 1.5 SAFETY (NEEDLE) ×3 IMPLANT
NEEDLE SPNL 20GX3.5 QUINCKE YW (NEEDLE) ×3 IMPLANT
NS IRRIG 1000ML POUR BTL (IV SOLUTION) ×3 IMPLANT
PACK LAMINECTOMY NEURO (CUSTOM PROCEDURE TRAY) ×3 IMPLANT
PAD ARMBOARD 7.5X6 YLW CONV (MISCELLANEOUS) ×9 IMPLANT
PLATE ARCHON 1-LEVEL 22MM (Plate) ×3 IMPLANT
RUBBERBAND STERILE (MISCELLANEOUS) ×6 IMPLANT
SCREW ARCHON SELFTAP 4.0X13 (Screw) ×12 IMPLANT
SPONGE INTESTINAL PEANUT (DISPOSABLE) ×3 IMPLANT
SPONGE SURGIFOAM ABS GEL SZ50 (HEMOSTASIS) ×3 IMPLANT
STRIP CLOSURE SKIN 1/2X4 (GAUZE/BANDAGES/DRESSINGS) ×2 IMPLANT
SUT VIC AB 3-0 SH 8-18 (SUTURE) ×6 IMPLANT
SYR 3ML LL SCALE MARK (SYRINGE) ×3 IMPLANT
TOWEL OR 17X24 6PK STRL BLUE (TOWEL DISPOSABLE) ×3 IMPLANT
TOWEL OR 17X26 10 PK STRL BLUE (TOWEL DISPOSABLE) ×3 IMPLANT
TRAP SPECIMEN MUCOUS 40CC (MISCELLANEOUS) IMPLANT
WATER STERILE IRR 1000ML POUR (IV SOLUTION) ×3 IMPLANT

## 2016-04-22 NOTE — H&P (Signed)
Subjective:   Patient is a 45 y.o. female admitted for neck pain. The patient first presented to me with complaints of neck pain, arm pain and numbness of the arm(s). Onset of symptoms was several months ago. The pain is described as aching and occurs all day. The pain is rated severe, and is located  In the neck and radiates to the arm. The symptoms have been progressive. Symptoms are exacerbated by extending head backwards, and are relieved by none.  Previous work up includes MRI of cervical spine, results: disc bulge at C4-C5 bilateral.  Past Medical History:  Diagnosis Date  . Hypertension     Past Surgical History:  Procedure Laterality Date  . ABDOMINAL HYSTERECTOMY    . APPENDECTOMY    . CHOLECYSTECTOMY N/A 11/22/2012   Procedure: LAPAROSCOPIC CHOLECYSTECTOMY WITH INTRAOPERATIVE CHOLANGIOGRAM;  Surgeon: Shann Medal, MD;  Location: [facility];  Service: General;  Laterality: N/A;  . HAND SURGERY    . NECK SURGERY      Allergies  Allergen Reactions  . Codeine Itching and Rash    Social History  Substance Use Topics  . Smoking status: Never Smoker  . Smokeless tobacco: Never Used  . Alcohol use No    History reviewed. No pertinent family history. Prior to Admission medications   Medication Sig Start Date End Date Taking? Authorizing Provider  ibuprofen (ADVIL,MOTRIN) 800 MG tablet Take 800 mg by mouth 3 (three) times daily as needed. For pain. 02/23/16  Yes Historical Provider, MD  lisinopril (PRINIVIL,ZESTRIL) 20 MG tablet Take 20 mg by mouth daily. 09/24/15  Yes Historical Provider, MD  Multiple Vitamin (MULTIVITAMIN WITH MINERALS) TABS tablet Take 1 tablet by mouth daily.   Yes Historical Provider, MD  methocarbamol (ROBAXIN) 500 MG tablet Take 1 tablet (500 mg total) by mouth 2 (two) times daily as needed for muscle spasms. Patient not taking: Reported on 04/15/2016 02/23/16   Waynetta Pean, PA-C  naproxen (NAPROSYN) 250 MG tablet Take 1 tablet (250 mg total) by mouth 2 (two)  times daily with a meal. Patient not taking: Reported on 04/15/2016 02/23/16   Waynetta Pean, PA-C     Review of Systems  Positive ROS: neg  All other systems have been reviewed and were otherwise negative with the exception of those mentioned in the HPI and as above.  Objective: Vital signs in last 24 hours: Temp:  [98.2 F (36.8 C)] 98.2 F (36.8 C) (11/10 [id number]) Pulse Rate:  [76] 76 (11/10 0642) Resp:  [20] 20 (11/10 0642) BP: (120)/(88) 120/88 (11/10 0642) SpO2:  [100 %] 100 % (11/10 0642) Weight:  [79.8 kg (176 lb)] 79.8 kg (176 lb) (11/10 [id number])  General Appearance: Alert, cooperative, no distress, appears stated age Head: Normocephalic, without obvious abnormality, atraumatic Eyes: PERRL, conjunctiva/corneas clear, EOM's intact      Neck: Supple, symmetrical, trachea midline, Back: Symmetric, no curvature, ROM normal, no CVA tenderness Lungs:  respirations unlabored Heart: Regular rate and rhythm Abdomen: Soft, non-tender Extremities: Extremities normal, atraumatic, no cyanosis or edema Pulses: 2+ and symmetric all extremities Skin: Skin color, texture, turgor normal, no rashes or lesions  NEUROLOGIC:  Mental status: Alert and oriented x4, no aphasia, good attention span, fund of knowledge and memory  Motor Exam - grossly normal Sensory Exam - grossly normal Reflexes: 1+ Coordination - grossly normal Gait - grossly normal Balance - grossly normal Cranial Nerves: I: smell Not tested  II: visual acuity  OS: nl    OD: nl  II: visual fields  Full to confrontation  II: pupils Equal, round, reactive to light  III,VII: ptosis None  III,IV,VI: extraocular muscles  Full ROM  V: mastication Normal  V: facial light touch sensation  Normal  V,VII: corneal reflex  Present  VII: facial muscle function - upper  Normal  VII: facial muscle function - lower Normal  VIII: hearing Not tested  IX: soft palate elevation  Normal  IX,X: gag reflex Present  XI: trapezius strength   5/5  XI: sternocleidomastoid strength 5/5  XI: neck flexion strength  5/5  XII: tongue strength  Normal    Data Review Lab Results  Component Value Date   WBC 7.0 04/19/2016   HGB 13.6 04/19/2016   HCT 41.7 04/19/2016   MCV 85.5 04/19/2016   PLT 256 04/19/2016   Lab Results  Component Value Date   NA 140 04/19/2016   K 4.0 04/19/2016   CL 106 04/19/2016   CO2 27 04/19/2016   BUN 8 04/19/2016   CREATININE 0.80 04/19/2016   GLUCOSE 83 04/19/2016   Lab Results  Component Value Date   INR 0.99 04/19/2016    Assessment:   Cervical neck pain with herniated nucleus pulposus/ spondylosis/ stenosis at C4-5. Patient has failed conservative therapy. Planned surgery : ACDF  Plan:   I explained the condition and procedure to the patient and answered any questions.  Patient wishes to proceed with procedure as planned. Understands risks/ benefits/ and expected or typical outcomes.  Brice Potteiger S 04/22/2016 6:43 AM

## 2016-04-22 NOTE — Op Note (Signed)
04/22/2016  9:54 AM  PATIENT:  Amber Cuevas  45 y.o. female  PRE-OPERATIVE DIAGNOSIS:  Cervical disc herniation C4-5 on the right with right C5 radiculopathy  POST-OPERATIVE DIAGNOSIS:  Same  PROCEDURE:  1. Decompressive anterior cervical discectomy C4-5, 2. Anterior cervical arthrodesis C4-5 utilizing a 7 mm peek interbody cage packed with local autograft, 3. Anterior cervical plating C4-5 utilizing a nuvasive plate  SURGEON:  Sherley Bounds, MD  ASSISTANTS: none  ANESTHESIA:   General  EBL: <25 ml  Total I/O In: 1200 [I.V.:1200] Out: -   BLOOD ADMINISTERED:none  DRAINS: none   SPECIMEN:  No Specimen  INDICATION FOR PROCEDURE: This patient underwent a previous C5-6 ACDF. She presented with severe right arm pain. MRI showed a large acute disc herniation at C4-5 on the right. She tried medical management. Recommended ACDF with plating at C4-5. Patient understood the risks, benefits, and alternatives and potential outcomes and wished to proceed.  PROCEDURE DETAILS: Patient was brought to the operating room placed under general endotracheal anesthesia. Patient was placed in the supine position on the operating room table. The neck was prepped with Duraprep and draped in a sterile fashion.   Three cc of local anesthesia was injected and a transverse incision was made on the right side of the neck.  Dissection was carried down thru the subcutaneous tissue and the platysma was  elevated, opened, and undermined with Metzenbaum scissors.  Dissection was then carried out thru an avascular plane leaving the sternocleidomastoid carotid artery and jugular vein laterally and the trachea and esophagus medially. The ventral aspect of the vertebral column was identified and a localizing x-ray was taken. The C4-5 level was identified. The longus colli muscles were then elevated and the retractor was placed. The annulus was incised and the disc space entered. Discectomy was performed with  micro-curettes and pituitary rongeurs. I then used the high-speed drill to drill the endplates down to the level of the posterior longitudinal ligament. The drill shavings were saved in a mucous trap for later arthrodesis. The operating microscope was draped and brought into the field provided additional magnification, illumination and visualization. Discectomy was continued posteriorly thru the disc space. Posterior longitudinal ligament was opened with a nerve hook, and then removed along with disc herniation and osteophytes, decompressing the spinal canal and thecal sac. We then continued to remove osteophytic overgrowth and disc material decompressing the neural foramina and exiting nerve roots bilaterally. The scope was angled up and down to help decompress and undercut the vertebral bodies. Once the decompression was completed we could pass a nerve hook circumferentially to assure adequate decompression in the midline and in the neural foramina. So by both visualization and palpation we felt we had an adequate decompression of the neural elements. We then measured the height of the intravertebral disc space and selected a 7 millimeter Peek interbody cage packed with autograft and morcellized allograft. It was then gently positioned in the intravertebral disc space and countersunk. I then used a 22 mm plate and placed four variable angle screws into the vertebral bodies and locked them into position. The wound was irrigated with bacitracin solution, checked for hemostasis which was established and confirmed. Once meticulous hemostasis was achieved, we then proceeded with closure. The platysma was closed with interrupted 3-0 undyed Vicryl suture, the subcuticular layer was closed with interrupted 3-0 undyed Vicryl suture. The skin edges were approximated with steristrips. The drapes were removed. A sterile dressing was applied. The patient was then awakened from general  anesthesia and transferred to the recovery  room in stable condition. At the end of the procedure all sponge, needle and instrument counts were correct.   PLAN OF CARE: Admit for overnight observation  PATIENT DISPOSITION:  PACU - hemodynamically stable.   Delay start of Pharmacological VTE agent (>24hrs) due to surgical blood loss or risk of bleeding:  yes

## 2016-04-22 NOTE — Anesthesia Procedure Notes (Signed)
Procedure Name: Intubation Date/Time: 04/22/2016 7:51 AM Performed by: Carney Living Pre-anesthesia Checklist: Patient identified, Emergency Drugs available, Suction available, Patient being monitored and Timeout performed Patient Re-evaluated:Patient Re-evaluated prior to inductionOxygen Delivery Method: Circle system utilized Preoxygenation: Pre-oxygenation with 100% oxygen Intubation Type: IV induction Ventilation: Mask ventilation without difficulty Laryngoscope Size: Mac and 4 Grade View: Grade I Tube type: Oral Tube size: 7.0 mm Number of attempts: 1 Airway Equipment and Method: Stylet Placement Confirmation: ETT inserted through vocal cords under direct vision,  positive ETCO2 and breath sounds checked- equal and bilateral Secured at: 21 cm Tube secured with: Tape Dental Injury: Teeth and Oropharynx as per pre-operative assessment  Comments: Intubation performed with minimal neck extension as above.

## 2016-04-22 NOTE — Progress Notes (Signed)
Orthopedic Tech Progress Note Patient Details:  Amber Cuevas Jul 06, 1970 [id number]  Ortho Devices Type of Ortho Device: Soft collar Ortho Device/Splint Interventions: Application   [place] Pink 04/22/2016, 3:32 PM

## 2016-04-22 NOTE — Anesthesia Postprocedure Evaluation (Signed)
Anesthesia Post Note  Patient: Amber Cuevas  Procedure(s) Performed: Procedure(s) (LRB): Anterior Cervical Discectomy Fusion - Cervical four-Cervical five (N/A)  Patient location during evaluation: PACU Anesthesia Type: General Level of consciousness: sedated Pain management: pain level controlled Vital Signs Assessment: post-procedure vital signs reviewed and stable Respiratory status: spontaneous breathing and respiratory function stable Cardiovascular status: stable Anesthetic complications: no    Last Vitals:  Vitals:   04/22/16 1035 04/22/16 1050  BP: (!) 133/94 118/88  Pulse: 87 83  Resp: 19 16  Temp:      Last Pain:  Vitals:   04/22/16 1050  TempSrc:   PainSc: Asleep    LLE Motor Response: Purposeful movement;Responds to commands (04/22/16 1050) LLE Sensation: Full sensation (04/22/16 1050) RLE Motor Response: Purposeful movement;Responds to commands (04/22/16 1050) RLE Sensation: Full sensation (04/22/16 1050)      [place]

## 2016-04-22 NOTE — Transfer of Care (Signed)
Immediate Anesthesia Transfer of Care Note  Patient: Amber Cuevas  Procedure(s) Performed: Procedure(s): Anterior Cervical Discectomy Fusion - Cervical four-Cervical five (N/A)  Patient Location: PACU  Anesthesia Type:General  Level of Consciousness: awake, alert , oriented and patient cooperative  Airway & Oxygen Therapy: Patient Spontanous Breathing and Patient connected to nasal cannula oxygen  Post-op Assessment: Report given to RN, Post -op Vital signs reviewed and stable and Patient moving all extremities X 4  Post vital signs: Reviewed and stable  Last Vitals:  Vitals:   04/22/16 0642 04/22/16 0950  BP: 120/88 120/89  Pulse: 76 94  Resp: 20 20  Temp: 36.8 C 36.4 C    Last Pain:  Vitals:   04/22/16 0950  TempSrc:   PainSc: (P) Asleep         Complications: No apparent anesthesia complications

## 2016-04-23 DIAGNOSIS — M50121 Cervical disc disorder at C4-C5 level with radiculopathy: Secondary | ICD-10-CM | POA: Diagnosis not present

## 2016-04-23 MED ORDER — METHOCARBAMOL 500 MG PO TABS: 500.0000 mg | ORAL_TABLET | Freq: Four times a day (QID) | ORAL | 0 refills | Status: DC | PRN

## 2016-04-23 MED ORDER — OXYCODONE-ACETAMINOPHEN 5-325 MG PO TABS: 1.0000 | ORAL_TABLET | Freq: Four times a day (QID) | ORAL | 0 refills | Status: DC | PRN

## 2016-04-23 NOTE — Progress Notes (Signed)
Patient alert and oriented, mae's well, voiding adequate amount of urine, swallowing without difficulty, no c/o pain at time of discharge. Patient discharged home with family. Script and discharged instructions given to patient. Patient and family stated understanding of instructions given. Patient has an appointment with Dr. Jones °

## 2016-04-23 NOTE — Discharge Summary (Signed)
Physician Discharge Summary  Patient ID: Amber Cuevas MRN: [id number] DOB/AGE: 46-May-1972 46 y.o.  Admit date: 04/22/2016 Discharge date: 04/23/2016  Admission Diagnoses: Cervical disc herniation C4-5    Discharge Diagnoses: Same   Discharged Condition: good  Hospital Course: The patient was admitted on 04/22/2016 and taken to the operating room where the patient underwent ACDF with plating C4-5. The patient tolerated the procedure well and was taken to the recovery room and then to the floor in stable condition. The hospital course was routine. There were no complications. The wound remained clean dry and intact. Pt had appropriate neck soreness. No complaints of arm pain or new N/T/W. The patient remained afebrile with stable vital signs, and tolerated a regular diet. The patient continued to increase activities, and pain was well controlled with oral pain medications.   Consults: None  Significant Diagnostic Studies:  Results for orders placed or performed during the hospital encounter of 04/19/16  Surgical pcr screen  Result Value Ref Range   MRSA, PCR NEGATIVE NEGATIVE   Staphylococcus aureus NEGATIVE NEGATIVE  Basic metabolic panel  Result Value Ref Range   Sodium 140 135 - 145 mmol/L   Potassium 4.0 3.5 - 5.1 mmol/L   Chloride 106 101 - 111 mmol/L   CO2 27 22 - 32 mmol/L   Glucose, Bld 83 65 - 99 mg/dL   BUN 8 6 - 20 mg/dL   Creatinine, Ser 0.80 0.44 - 1.00 mg/dL   Calcium 9.8 8.9 - 10.3 mg/dL   GFR calc non Af Amer >60 >60 mL/min   GFR calc Af Amer >60 >60 mL/min   Anion gap 7 5 - 15  CBC WITH DIFFERENTIAL  Result Value Ref Range   WBC 7.0 4.0 - 10.5 K/uL   RBC 4.88 3.87 - 5.11 MIL/uL   Hemoglobin 13.6 12.0 - 15.0 g/dL   HCT 41.7 36.0 - 46.0 %   MCV 85.5 78.0 - 100.0 fL   MCH 27.9 26.0 - 34.0 pg   MCHC 32.6 30.0 - 36.0 g/dL   RDW 13.2 11.5 - 15.5 %   Platelets 256 150 - 400 K/uL   Neutrophils Relative % 66 %   Neutro Abs 4.6 1.7 - 7.7 K/uL   Lymphocytes  Relative 29 %   Lymphs Abs 2.0 0.7 - 4.0 K/uL   Monocytes Relative 4 %   Monocytes Absolute 0.3 0.1 - 1.0 K/uL   Eosinophils Relative 1 %   Eosinophils Absolute 0.1 0.0 - 0.7 K/uL   Basophils Relative 0 %   Basophils Absolute 0.0 0.0 - 0.1 K/uL  Protime-INR  Result Value Ref Range   Prothrombin Time 13.1 11.4 - 15.2 seconds   INR 0.99     Chest 2 View  Result Date: 04/19/2016 CLINICAL DATA:  45 year old female with a history of cervical spine stenosis EXAM: CHEST  2 VIEW COMPARISON:  11/24/2014, 11/14/2014 FINDINGS: Cardiomediastinal silhouette within normal limits. No confluent airspace disease, pneumothorax, or pleural effusion. No displaced fracture IMPRESSION: No radiographic evidence of acute cardiopulmonary disease Signed, Dulcy Fanny. Earleen Newport, DO [facility] Electronically Signed   By: Corrie Mckusick D.O.   On: 04/19/2016 15:42   Dg Cervical Spine 1 View  Result Date: 04/22/2016 CLINICAL DATA:  Status post anterior fusion EXAM: DG C-ARM 61-120 MIN; DG CERVICAL SPINE - 1 VIEW COMPARISON:  September 17, 2009 FLUOROSCOPY TIME:  0 minutes 5 seconds; 1 acquired image FINDINGS: There is anterior screw and plate fixation at C4  and C5. The screw and plate fixation device appears intact as does a disc spacer at C4-5. There is been previous surgical fixation at C5 and C6 with ankylosis at C5-6, stable. There is currently mild disc space narrowing at C3-4. There is no fracture or spondylolisthesis. IMPRESSION: New anterior screw and plate fixation at C4 and C5 with support hardware intact. Stable ankylosis C5-6. No acute fracture or spondylolisthesis. Electronically Signed   By: Lowella Grip III M.D.   On: 04/22/2016 09:54   Dg C-arm 1-60 Min  Result Date: 04/22/2016 CLINICAL DATA:  Status post anterior fusion EXAM: DG C-ARM 61-120 MIN; DG CERVICAL SPINE - 1 VIEW COMPARISON:  September 17, 2009 FLUOROSCOPY TIME:  0 minutes 5 seconds; 1 acquired  image FINDINGS: There is anterior screw and plate fixation at C4 and C5. The screw and plate fixation device appears intact as does a disc spacer at C4-5. There is been previous surgical fixation at C5 and C6 with ankylosis at C5-6, stable. There is currently mild disc space narrowing at C3-4. There is no fracture or spondylolisthesis. IMPRESSION: New anterior screw and plate fixation at C4 and C5 with support hardware intact. Stable ankylosis C5-6. No acute fracture or spondylolisthesis. Electronically Signed   By: Lowella Grip III M.D.   On: 04/22/2016 09:54    Antibiotics:  Anti-infectives    Start     Dose/Rate Route Frequency Ordered Stop   04/22/16 1600  ceFAZolin (ANCEF) IVPB 1 g/50 mL premix     1 g 100 mL/hr over 30 Minutes Intravenous Every 8 hours 04/22/16 1137 04/22/16 2334   04/22/16 0830  bacitracin 50,000 Units in sodium chloride irrigation 0.9 % 500 mL irrigation  Status:  Discontinued       As needed 04/22/16 0831 04/22/16 0946   04/22/16 0613  ceFAZolin (ANCEF) IVPB 2g/100 mL premix     2 g 200 mL/hr over 30 Minutes Intravenous On call to O.R. 04/22/16 [id number] 04/22/16 0755      Discharge Exam: Blood pressure 119/79, pulse 71, temperature 97.9 F (36.6 C), temperature source Oral, resp. rate 18, height 5\' 3"  (1.6 m), weight 79.8 kg (176 lb), SpO2 98 %. Neurologic: Grossly normal Dressing dry  Discharge Medications:     Medication List    STOP taking these medications   ibuprofen 800 MG tablet Commonly known as:  ADVIL,MOTRIN   naproxen 250 MG tablet Commonly known as:  NAPROSYN     TAKE these medications   lisinopril 20 MG tablet Commonly known as:  PRINIVIL,ZESTRIL Take 20 mg by mouth daily.   methocarbamol 500 MG tablet Commonly known as:  ROBAXIN Take 1 tablet (500 mg total) by mouth every 6 (six) hours as needed for muscle spasms. What changed:  when to take this   multivitamin with minerals Tabs tablet Take 1 tablet by mouth daily.    oxyCODONE-acetaminophen 5-325 MG tablet Commonly known as:  PERCOCET/ROXICET Take 1-2 tablets by mouth every 6 (six) hours as needed for moderate pain.       Disposition: home   Final Dx: ACDF plate C4-5  Discharge Instructions    Call MD for:  difficulty breathing, headache or visual disturbances    Complete by:  As directed    Call MD for:  persistant nausea and vomiting    Complete by:  As directed    Call MD for:  redness, tenderness, or signs of infection (pain, swelling, redness, odor or green/yellow discharge around incision site)    Complete  by:  As directed    Call MD for:  severe uncontrolled pain    Complete by:  As directed    Call MD for:  temperature >100.4    Complete by:  As directed    Diet - low sodium heart healthy    Complete by:  As directed    Discharge instructions    Complete by:  As directed    No strenuous activity or heavy lifting, may shower   Increase activity slowly    Complete by:  As directed    Remove dressing in 48 hours    Complete by:  As directed          Signed: Sajid Ruppert S 04/23/2016, 6:41 AM

## 2016-04-24 ENCOUNTER — Encounter (HOSPITAL_COMMUNITY): Payer: Self-pay | Admitting: Neurological Surgery

## 2016-05-27 ENCOUNTER — Ambulatory Visit: Payer: BLUE CROSS/BLUE SHIELD | Admitting: Neurology

## 2017-04-04 ENCOUNTER — Ambulatory Visit
Admission: RE | Admit: 2017-04-04 | Discharge: 2017-04-04 | Disposition: A | Discharge status: Home / Self Care | Payer: 59 | Source: Ambulatory Visit | Attending: Internal Medicine | Admitting: Internal Medicine

## 2017-04-04 ENCOUNTER — Other Ambulatory Visit: Payer: Self-pay | Admitting: Internal Medicine

## 2017-04-04 DIAGNOSIS — Z1231 Encounter for screening mammogram for malignant neoplasm of breast: Secondary | ICD-10-CM

## 2017-06-13 ENCOUNTER — Other Ambulatory Visit: Payer: Self-pay

## 2017-06-13 ENCOUNTER — Emergency Department (HOSPITAL_COMMUNITY): Payer: 59

## 2017-06-13 ENCOUNTER — Emergency Department (HOSPITAL_COMMUNITY)
Admission: EM | Admit: 2017-06-13 | Discharge: 2017-06-13 | Disposition: A | Discharge status: Home / Self Care | Payer: 59 | Attending: Emergency Medicine | Admitting: Emergency Medicine

## 2017-06-13 ENCOUNTER — Encounter (HOSPITAL_COMMUNITY): Payer: Self-pay

## 2017-06-13 DIAGNOSIS — J019 Acute sinusitis, unspecified: Secondary | ICD-10-CM | POA: Diagnosis not present

## 2017-06-13 DIAGNOSIS — Z79899 Other long term (current) drug therapy: Secondary | ICD-10-CM | POA: Diagnosis not present

## 2017-06-13 DIAGNOSIS — R05 Cough: Secondary | ICD-10-CM | POA: Diagnosis present

## 2017-06-13 DIAGNOSIS — I10 Essential (primary) hypertension: Secondary | ICD-10-CM | POA: Insufficient documentation

## 2017-06-13 DIAGNOSIS — Z9049 Acquired absence of other specified parts of digestive tract: Secondary | ICD-10-CM | POA: Insufficient documentation

## 2017-06-13 DIAGNOSIS — R059 Cough, unspecified: Secondary | ICD-10-CM

## 2017-06-13 MED ORDER — BENZONATATE 200 MG PO CAPS: 200.0000 mg | ORAL_CAPSULE | Freq: Three times a day (TID) | ORAL | 0 refills | Status: AC | PRN

## 2017-06-13 MED ORDER — AMOXICILLIN-POT CLAVULANATE 875-125 MG PO TABS: 1.0000 | ORAL_TABLET | Freq: Two times a day (BID) | ORAL | 0 refills | Status: AC

## 2017-06-13 NOTE — ED Provider Notes (Signed)
[facility] EMERGENCY DEPARTMENT Provider Note   CSN: [id number] Arrival date & time: 06/13/17  1230     History   Chief Complaint Chief Complaint  Patient presents with  . Cough    HPI Amber Cuevas is a 47 y.o. female with a history of hypertension who presents today for evaluation of cough, and cold-like symptoms for 1-1/2 months.  She reports that she has tried "everything available over-the-counter" and has not had any significant relief.  She reports forehead/sinus pressure, cough, sore throat, nasal congestion along with 1 week of body aches and subjective fevers.  She reports that her cough is interfering with her sleep and her usual activities.  She has not seen her primary care provider for this.  HPI  Past Medical History:  Diagnosis Date  . Hypertension     Patient Active Problem List   Diagnosis Date Noted  . S/P cervical spinal fusion 04/22/2016  . Lipoma of back 09/19/2013  . Gallstones 10/30/2012    Past Surgical History:  Procedure Laterality Date  . ABDOMINAL HYSTERECTOMY    . ANTERIOR CERVICAL DECOMP/DISCECTOMY FUSION N/A 04/22/2016   Procedure: Anterior Cervical Discectomy Fusion - Cervical four-Cervical five;  Surgeon: Eustace Moore, MD;  Location: [facility];  Service: Neurosurgery;  Laterality: N/A;  . APPENDECTOMY    . CHOLECYSTECTOMY N/A 11/22/2012   Procedure: LAPAROSCOPIC CHOLECYSTECTOMY WITH INTRAOPERATIVE CHOLANGIOGRAM;  Surgeon: Shann Medal, MD;  Location: [facility];  Service: General;  Laterality: N/A;  . HAND SURGERY    . NECK SURGERY      OB History    No data available       Home Medications    Prior to Admission medications   Medication Sig Start Date End Date Taking? Authorizing Provider  amoxicillin-clavulanate (AUGMENTIN) 875-125 MG tablet Take 1 tablet by mouth 2 (two) times daily for 10 days. 06/13/17 06/23/17  Lorin Glass, PA-C  benzonatate (TESSALON) 200 MG capsule Take 1 capsule (200 mg total) by  mouth 3 (three) times daily as needed for up to 7 days for cough. 06/13/17 06/20/17  Lorin Glass, PA-C  lisinopril (PRINIVIL,ZESTRIL) 20 MG tablet Take 20 mg by mouth daily. 09/24/15   [provider]  methocarbamol (ROBAXIN) 500 MG tablet Take 1 tablet (500 mg total) by mouth every 6 (six) hours as needed for muscle spasms. 04/23/16   Eustace Moore, MD  Multiple Vitamin (MULTIVITAMIN WITH MINERALS) TABS tablet Take 1 tablet by mouth daily.    [provider]  oxyCODONE-acetaminophen (PERCOCET/ROXICET) 5-325 MG tablet Take 1-2 tablets by mouth every 6 (six) hours as needed for moderate pain. 04/23/16   Eustace Moore, MD    Family History History reviewed. No pertinent family history.  Social History Social History   Tobacco Use  . Smoking status: Never Smoker  . Smokeless tobacco: Never Used  Substance Use Topics  . Alcohol use: No  . Drug use: No     Allergies   Codeine   Review of Systems Review of Systems  Constitutional: Positive for fatigue and fever.  HENT: Positive for congestion, postnasal drip, rhinorrhea, sinus pressure, sinus pain and sore throat. Negative for trouble swallowing and voice change.   Eyes: Negative for visual disturbance.  Respiratory: Positive for cough. Negative for choking, chest tightness and shortness of breath.   Cardiovascular: Negative for chest pain.  Gastrointestinal: Negative for abdominal pain, constipation, nausea and vomiting.  Skin: Negative for rash.  Neurological: Positive for headaches.  Physical Exam Updated Vital Signs BP 126/89 (BP Location: Right Arm)   Pulse 83   Temp 98.8 F (37.1 C) (Oral)   Resp 16   SpO2 100%   Physical Exam  Constitutional: She is oriented to person, place, and time. She appears well-developed and well-nourished. No distress.  HENT:  Head: Normocephalic and atraumatic.  Right Ear: Tympanic membrane, external ear and ear canal normal.  Left Ear: Tympanic membrane,  external ear and ear canal normal.  Nose: Mucosal edema and rhinorrhea present. Right sinus exhibits maxillary sinus tenderness and frontal sinus tenderness. Left sinus exhibits maxillary sinus tenderness and frontal sinus tenderness.  Mouth/Throat: Uvula is midline, oropharynx is clear and moist and mucous membranes are normal. No oropharyngeal exudate. No tonsillar exudate.  Eyes: Conjunctivae are normal. No scleral icterus.  Neck: Trachea normal, normal range of motion, full passive range of motion without pain and phonation normal. Neck supple. No spinous process tenderness present.  Cardiovascular: Normal rate, regular rhythm and intact distal pulses.  Pulmonary/Chest: Effort normal and breath sounds normal. No respiratory distress. She has no wheezes.  Lymphadenopathy:    She has no cervical adenopathy.  Neurological: She is alert and oriented to person, place, and time.  Skin: Skin is warm and dry. She is not diaphoretic.  Psychiatric: She has a normal mood and affect. Her behavior is normal.  Nursing note and vitals reviewed.    ED Treatments / Results  Labs (all labs ordered are listed, but only abnormal results are displayed) Labs Reviewed - No data to display  EKG  EKG Interpretation None       Radiology Dg Chest 2 View  Result Date: 06/13/2017 CLINICAL DATA:  Cough and cold symptoms for 1.5 months. EXAM: CHEST  2 VIEW COMPARISON:  April 19, 2016 FINDINGS: The heart size and mediastinal contours are within normal limits. Both lungs are clear. The visualized skeletal structures are stable. IMPRESSION: No active cardiopulmonary disease. Electronically Signed   By: Abelardo Diesel M.D.   On: 06/13/2017 18:28    Procedures Procedures (including critical care time)  Medications Ordered in ED Medications - No data to display   Initial Impression / Assessment and Plan / ED Course  I have reviewed the triage vital signs and the nursing notes.  Pertinent labs & imaging  results that were available during my care of the patient were reviewed by me and considered in my medical decision making (see chart for details).    Patient complaining of symptoms of sinusitis.    Severe symptoms have been present for greater than 10 days with purulent nasal discharge and maxillary sinus pain.  Given cough for 6 weeks chest x-ray was obtained without significant abnormalities.  Concern for acute bacterial rhinosinusitis.  Patient discharged with Augmentin.  Instructions given for warm saline nasal wash and recommendations for follow-up with primary care physician.      Final Clinical Impressions(s) / ED Diagnoses   Final diagnoses:  Acute sinusitis, recurrence not specified, unspecified location  Cough    ED Discharge Orders        Ordered    amoxicillin-clavulanate (AUGMENTIN) 875-125 MG tablet  2 times daily     06/13/17 1933    benzonatate (TESSALON) 200 MG capsule  3 times daily PRN     06/13/17 1933       Lorin Glass, Hershal Coria 06/13/17 1941    Fatima Blank, MD 06/17/17 [phone]

## 2017-06-13 NOTE — ED Triage Notes (Signed)
Onset 1 1/2 months cough/cold.  Onset 1 week body aches, fevers.  Pt has tried numerous cough medications with no relief.  Cough is interfering with sleep and usual activities.

## 2017-06-13 NOTE — Discharge Instructions (Signed)

## 2017-08-28 IMAGING — MG MM DIGITAL SCREENING BILAT
4 series · 4 of 4 positions shown · non-contrast
Comparison: Previous exam(s).

CLINICAL DATA: Screening.

EXAM:
DIGITAL SCREENING BILATERAL MAMMOGRAM WITH CAD

[R MLO]
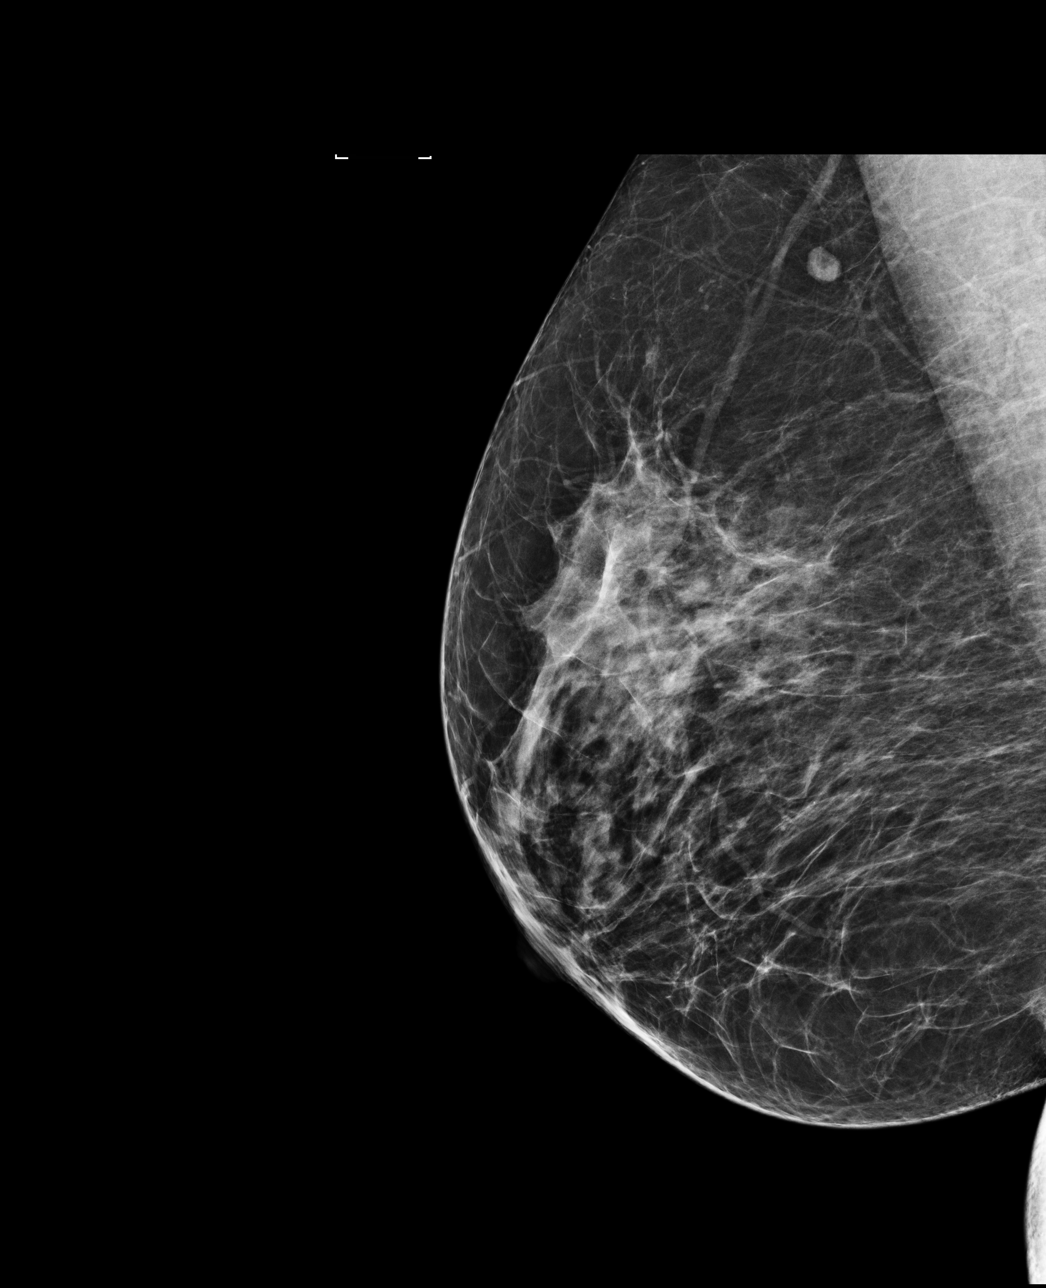

[L CC]
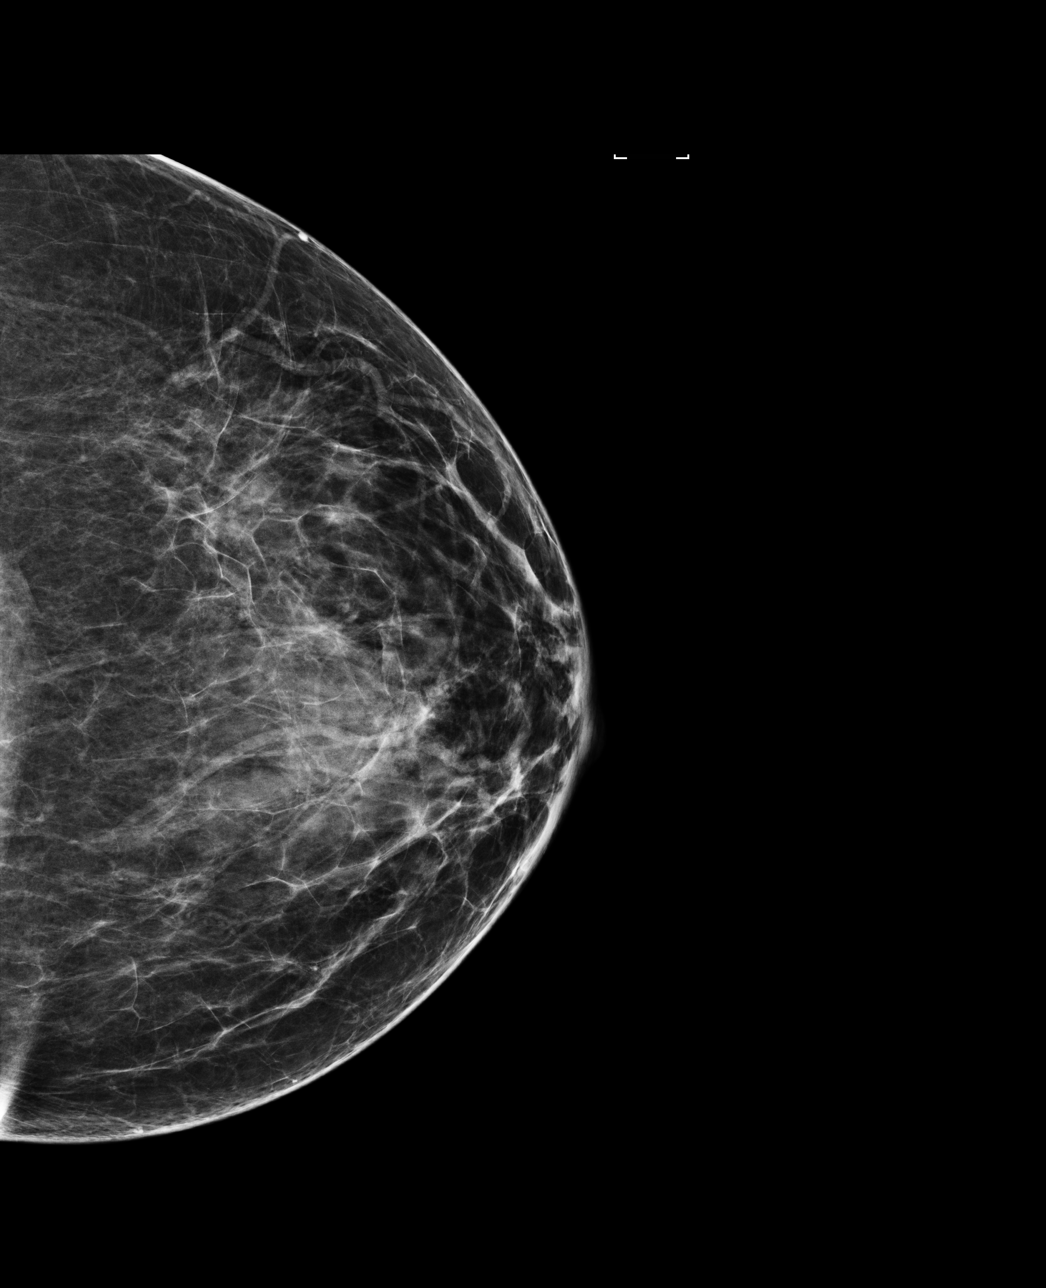

[R CC]
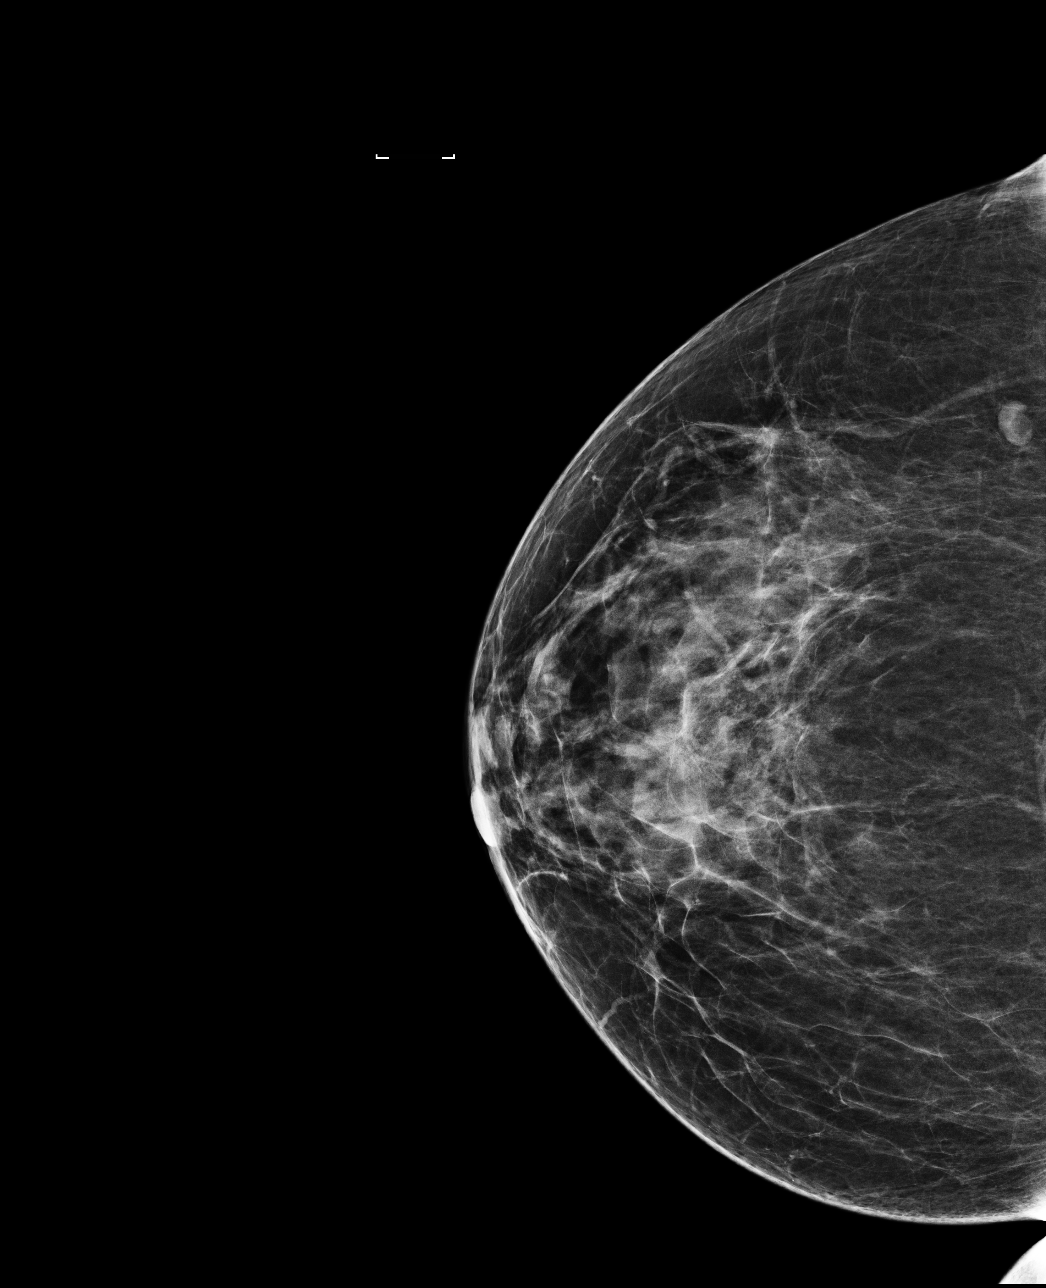

[L MLO]
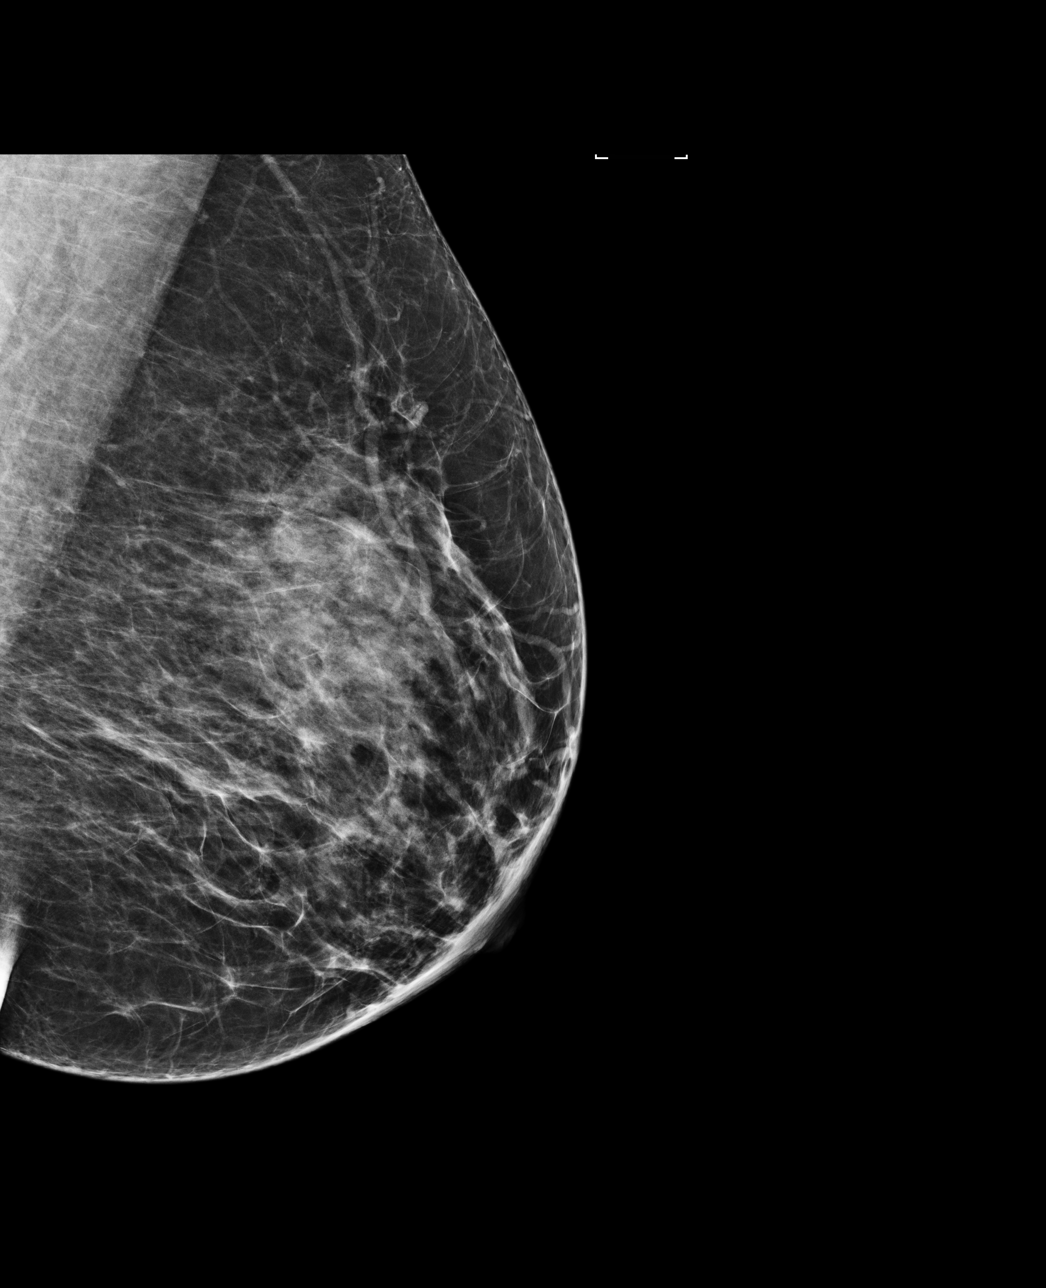

[4 of 4 positions shown; findings below may reference images not displayed]

ACR Breast Density Category c: The breast tissue is heterogeneously
dense, which may obscure small masses.
FINDINGS: There are no findings suspicious for malignancy. Images were
processed with CAD.
IMPRESSION: No mammographic evidence of malignancy. A result letter of this
screening mammogram will be mailed directly to the patient.

RECOMMENDATION:
Screening mammogram in one year. (Code:YJ-2-FEZ)

BI-RADS CATEGORY  1: Negative.

## 2017-09-05 ENCOUNTER — Emergency Department (HOSPITAL_COMMUNITY)
Admission: EM | Admit: 2017-09-05 | Discharge: 2017-09-06 | Disposition: A | Discharge status: Home / Self Care | Payer: 59 | Attending: Emergency Medicine | Admitting: Emergency Medicine

## 2017-09-05 ENCOUNTER — Emergency Department (HOSPITAL_COMMUNITY): Payer: 59

## 2017-09-05 ENCOUNTER — Encounter (HOSPITAL_COMMUNITY): Payer: Self-pay

## 2017-09-05 DIAGNOSIS — R0789 Other chest pain: Secondary | ICD-10-CM | POA: Diagnosis not present

## 2017-09-05 DIAGNOSIS — Z79899 Other long term (current) drug therapy: Secondary | ICD-10-CM | POA: Insufficient documentation

## 2017-09-05 DIAGNOSIS — M79601 Pain in right arm: Secondary | ICD-10-CM | POA: Diagnosis not present

## 2017-09-05 DIAGNOSIS — I10 Essential (primary) hypertension: Secondary | ICD-10-CM | POA: Diagnosis not present

## 2017-09-05 DIAGNOSIS — M79604 Pain in right leg: Secondary | ICD-10-CM | POA: Diagnosis not present

## 2017-09-05 LAB — BASIC METABOLIC PANEL
ANION GAP: 14 (ref 5–15)
BUN: 10 mg/dL (ref 6–20)
CALCIUM: 9.2 mg/dL (ref 8.9–10.3)
CO2: 18 mmol/L — ABNORMAL LOW (ref 22–32)
Chloride: 106 mmol/L (ref 101–111)
Creatinine, Ser: 0.85 mg/dL (ref 0.44–1.00)
GLUCOSE: 88 mg/dL (ref 65–99)
Potassium: 3.9 mmol/L (ref 3.5–5.1)
Sodium: 138 mmol/L (ref 135–145)

## 2017-09-05 LAB — CBC
HCT: 40.2 % (ref 36.0–46.0)
Hemoglobin: 13 g/dL (ref 12.0–15.0)
MCH: 27.7 pg (ref 26.0–34.0)
MCHC: 32.3 g/dL (ref 30.0–36.0)
MCV: 85.7 fL (ref 78.0–100.0)
PLATELETS: 260 10*3/uL (ref 150–400)
RBC: 4.69 MIL/uL (ref 3.87–5.11)
RDW: 13.7 % (ref 11.5–15.5)
WBC: 8.5 10*3/uL (ref 4.0–10.5)

## 2017-09-05 LAB — I-STAT BETA HCG BLOOD, ED (MC, WL, AP ONLY): I-stat hCG, quantitative: <5 m[IU]/mL (ref <5)

## 2017-09-05 LAB — I-STAT TROPONIN, ED: TROPONIN I, POC: 0 ng/mL (ref 0.00–0.08)

## 2017-09-05 NOTE — ED Provider Notes (Signed)
[facility] EMERGENCY DEPARTMENT Provider Note   CSN: [id number] Arrival date & time: 09/05/17  1724     History   Chief Complaint Chief Complaint  Patient presents with  . Chest Pain    HPI Amber Cuevas is a 47 y.o. female.  HPI Amber Cuevas is a 47 y.o. female presents to emergency department with complaint of pain to her right arm and right leg.  She states that she has had symptoms on and off for several days.  She states she was at work today and her pain in her arm and in her leg got worse.  She denies any numbness or tingling.  She denies any weakness.  She denies any shortness of breath.  She states she has had some right-sided upper chest wall pain that she is not having at this time.  She does have history of herniated disks in her neck and has had 2 cervical fusions in the past.  She denies any neck at this time.  She denies any trauma or injuries.  She denies any strenuous activity.  She is taking ibuprofen but states is not helping.  Denies any fever or chills.  No IV drug use.  Past Medical History:  Diagnosis Date  . Hypertension     Patient Active Problem List   Diagnosis Date Noted  . S/P cervical spinal fusion 04/22/2016  . Lipoma of back 09/19/2013  . Gallstones 10/30/2012    Past Surgical History:  Procedure Laterality Date  . ABDOMINAL HYSTERECTOMY    . ANTERIOR CERVICAL DECOMP/DISCECTOMY FUSION N/A 04/22/2016   Procedure: Anterior Cervical Discectomy Fusion - Cervical four-Cervical five;  Surgeon: Eustace Moore, MD;  Location: [facility];  Service: Neurosurgery;  Laterality: N/A;  . APPENDECTOMY    . CHOLECYSTECTOMY N/A 11/22/2012   Procedure: LAPAROSCOPIC CHOLECYSTECTOMY WITH INTRAOPERATIVE CHOLANGIOGRAM;  Surgeon: Shann Medal, MD;  Location: [facility];  Service: General;  Laterality: N/A;  . HAND SURGERY    . NECK SURGERY       OB History   None      Home Medications    Prior to Admission medications   Medication Sig  Start Date End Date Taking? Authorizing Provider  lisinopril (PRINIVIL,ZESTRIL) 20 MG tablet Take 20 mg by mouth daily. 09/24/15   [provider]  methocarbamol (ROBAXIN) 500 MG tablet Take 1 tablet (500 mg total) by mouth every 6 (six) hours as needed for muscle spasms. 04/23/16   Eustace Moore, MD  Multiple Vitamin (MULTIVITAMIN WITH MINERALS) TABS tablet Take 1 tablet by mouth daily.    [provider]  oxyCODONE-acetaminophen (PERCOCET/ROXICET) 5-325 MG tablet Take 1-2 tablets by mouth every 6 (six) hours as needed for moderate pain. 04/23/16   Eustace Moore, MD    Family History No family history on file.  Social History Social History   Tobacco Use  . Smoking status: Never Smoker  . Smokeless tobacco: Never Used  Substance Use Topics  . Alcohol use: No  . Drug use: No     Allergies   Codeine   Review of Systems Review of Systems  Constitutional: Negative for chills and fever.  Respiratory: Negative for cough, chest tightness and shortness of breath.   Cardiovascular: Positive for chest pain. Negative for palpitations and leg swelling.  Gastrointestinal: Negative for abdominal pain, diarrhea, nausea and vomiting.  Genitourinary: Negative for dysuria, flank pain, pelvic pain, vaginal bleeding, vaginal discharge and vaginal pain.  Musculoskeletal: Positive for arthralgias  and myalgias. Negative for back pain, neck pain and neck stiffness.  Skin: Negative for rash.  Neurological: Negative for dizziness, weakness and headaches.  All other systems reviewed and are negative.    Physical Exam Updated Vital Signs BP (!) 122/96 (BP Location: Left Arm)   Pulse 72   Temp 97.8 F (36.6 C) (Oral)   Resp 16   SpO2 100%   Physical Exam  Constitutional: She appears well-developed and well-nourished. No distress.  HENT:  Head: Normocephalic.  Eyes: Conjunctivae are normal.  Neck: Neck supple.  Cardiovascular: Normal rate, regular rhythm and normal heart  sounds.  Pulmonary/Chest: Effort normal and breath sounds normal. No respiratory distress. She has no wheezes. She has no rales.  Abdominal: Soft. Bowel sounds are normal. She exhibits no distension. There is no tenderness. There is no rebound.  Musculoskeletal: She exhibits no edema.  Diffuse tenderness to palpation of her right upper arm, right forearm, right thigh, right calf.  There is no swelling of upper or lower extremity.  There is no erythema, rashes, skin discoloration.  Dorsal pedal and distal radial pulses intact.  Strength intact of the bicep, tricep, deltoid, grip, right quadriceps, dorsiflexion and plantarflexion of the foot.  Neurological: She is alert. She displays normal reflexes.  Normal sensation of the upper and lower extremity.  Strength intact in upper and lower extremity.  Skin: Skin is warm and dry.  Psychiatric: She has a normal mood and affect. Her behavior is normal.  Nursing note and vitals reviewed.    ED Treatments / Results  Labs (all labs ordered are listed, but only abnormal results are displayed) Labs Reviewed  BASIC METABOLIC PANEL - Abnormal; Notable for the following components:      Result Value   CO2 18 (*)    All other components within normal limits  CBC  I-STAT TROPONIN, ED  I-STAT BETA HCG BLOOD, ED ([facility], [facility], [facility] ONLY)    EKG None  Radiology Dg Chest 2 View  Result Date: 09/05/2017 CLINICAL DATA:  Chest pain EXAM: CHEST - 2 VIEW COMPARISON:  06/13/2017 FINDINGS: The heart size and mediastinal contours are within normal limits. Both lungs are clear. The visualized skeletal structures are unremarkable. IMPRESSION: No active cardiopulmonary disease. Electronically Signed   By: Franchot Gallo M.D.   On: 09/05/2017 19:00    Procedures Procedures (including critical care time)  Medications Ordered in ED Medications - No data to display   Initial Impression / Assessment and Plan / ED Course  I have reviewed the triage vital signs and the  nursing notes.  Pertinent labs & imaging results that were available during my care of the patient were reviewed by me and considered in my medical decision making (see chart for details).     Patient in emergency department with aching and pain in the right arm and right leg, intermittent for several days.  She does have history of cervical fusion however no neck pain or injuries.  She is got normal examination including strength and sensation of the upper and lower extremities, normal reflexes.  She does have diffuse tenderness to palpation of the right upper and lower arm as well as right leg.  There is no swelling.  Normal pulses.  Denies any injuries or any strenuous activity.  Discussed that this could be coming from her neck or her back.  Patient was seen in triage and basic chest pain orders were placed, all blood work unremarkable other than CO2 level of 18.  Given normal examination, I do not think she needs any further workup for her right arm and leg pain at this time.  We discussed treatment with Tylenol and Motrin, and prednisone for possible radiculopathy.  She stated "I do not want to take any medications if I do not know what is exactly going on."  She asked if could run a test that would check for pinched nerve.  I explained to her that test would be MRI and we would need to be ordered on an outpatient setting by her family doctor, given no emergent finding at this time.  Patient's neck surgery was performed by Dr. Ronnald Ramp, advised her to follow-up with him or primary care doctor.  Patient seemed very upset by the fact that we were not able to do the MRI at this time.  I again explained to her that we normally do not perform MRIs unless there is concern for permanent nerve damage, again offered her medications, which she refused.  Patient left without waiting on her papers.  Vitals:   09/05/17 1746 09/05/17 2018  BP: 135/88 (!) 122/96  Pulse: 72 72  Resp: 18 16  Temp: 98.5 F (36.9 C)  97.8 F (36.6 C)  TempSrc: Oral Oral  SpO2: 100% 100%     Final Clinical Impressions(s) / ED Diagnoses   Final diagnoses:  Arm pain, right  Leg pain, right    ED Discharge Orders    None       Jeannett Senior, PA-C 09/05/17 2345    Ward, Delice Bison, DO 09/05/17 2355

## 2017-09-05 NOTE — ED Triage Notes (Signed)
Pt reports chest pain and right arm pain that comes and goes x 3 days. PT states today the pain in right arm has been constant and radiates down entire side. Denies sob, n/v.

## 2017-09-06 NOTE — ED Notes (Signed)
Pt eloped AMA after refusing course of treatment suggested by PA. Departed in NAD, without waiting for papers.

## 2018-02-02 ENCOUNTER — Emergency Department (HOSPITAL_COMMUNITY)
Admission: EM | Admit: 2018-02-02 | Discharge: 2018-02-02 | Disposition: A | Discharge status: Home / Self Care | Payer: 59 | Attending: Emergency Medicine | Admitting: Emergency Medicine

## 2018-02-02 ENCOUNTER — Encounter (HOSPITAL_COMMUNITY): Payer: Self-pay | Admitting: *Deleted

## 2018-02-02 DIAGNOSIS — Y939 Activity, unspecified: Secondary | ICD-10-CM | POA: Diagnosis not present

## 2018-02-02 DIAGNOSIS — S39012A Strain of muscle, fascia and tendon of lower back, initial encounter: Secondary | ICD-10-CM | POA: Insufficient documentation

## 2018-02-02 DIAGNOSIS — S3992XA Unspecified injury of lower back, initial encounter: Secondary | ICD-10-CM | POA: Diagnosis present

## 2018-02-02 DIAGNOSIS — I10 Essential (primary) hypertension: Secondary | ICD-10-CM | POA: Diagnosis not present

## 2018-02-02 DIAGNOSIS — Y999 Unspecified external cause status: Secondary | ICD-10-CM | POA: Diagnosis not present

## 2018-02-02 DIAGNOSIS — X58XXXA Exposure to other specified factors, initial encounter: Secondary | ICD-10-CM | POA: Diagnosis not present

## 2018-02-02 DIAGNOSIS — Z79899 Other long term (current) drug therapy: Secondary | ICD-10-CM | POA: Diagnosis not present

## 2018-02-02 DIAGNOSIS — Y929 Unspecified place or not applicable: Secondary | ICD-10-CM | POA: Diagnosis not present

## 2018-02-02 LAB — URINALYSIS, ROUTINE W REFLEX MICROSCOPIC
Bilirubin Urine: NEGATIVE
Glucose, UA: NEGATIVE mg/dL
Ketones, ur: NEGATIVE mg/dL
Leukocytes, UA: NEGATIVE
Nitrite: NEGATIVE
PROTEIN: NEGATIVE mg/dL
SPECIFIC GRAVITY, URINE: 1.017 (ref 1.005–1.030)
pH: 5 (ref 5.0–8.0)

## 2018-02-02 LAB — PREGNANCY, URINE: PREG TEST UR: NEGATIVE

## 2018-02-02 MED ORDER — CYCLOBENZAPRINE HCL 10 MG PO TABS: 10.0000 mg | ORAL_TABLET | Freq: Two times a day (BID) | ORAL | 0 refills | Status: DC | PRN

## 2018-02-02 MED ORDER — NAPROXEN 500 MG PO TABS: 500.0000 mg | ORAL_TABLET | Freq: Two times a day (BID) | ORAL | 0 refills | Status: DC

## 2018-02-02 MED ORDER — IBUPROFEN 200 MG PO TABS
600.0000 mg | ORAL_TABLET | Freq: Once | ORAL | Status: AC
  Administered 2018-02-02: 600 mg via ORAL
  Filled 2018-02-02: qty 3

## 2018-02-02 NOTE — ED Provider Notes (Signed)
[facility] DEPT Provider Note   CSN: [id number] Arrival date & time: 02/02/18  0818     History   Chief Complaint Chief Complaint  Patient presents with  . Back Pain  . Hypertension    HPI Amber Cuevas is a 47 y.o. female.  HPI Pt is having severe back pain in lower back.  It feels like an aching like she is in labor.  It is in the middle of the lower back and radiates down.  No numbness or weakness in her legs but sometimes the pain shoots to her right leg.  No urinary issues but she does feel like she has to urinate often.. No abdominal pain.  Pt was also having issues with her blood pressure this past week.   She has a primary care doctor and has been taking her blood pressure medications.  She has had episodes up to the 170s.  Pt tried to call her doctor but could not get an appointment. Past Medical History:  Diagnosis Date  . Hypertension     Patient Active Problem List   Diagnosis Date Noted  . S/P cervical spinal fusion 04/22/2016  . Lipoma of back 09/19/2013  . Gallstones 10/30/2012    Past Surgical History:  Procedure Laterality Date  . ABDOMINAL HYSTERECTOMY    . ANTERIOR CERVICAL DECOMP/DISCECTOMY FUSION N/A 04/22/2016   Procedure: Anterior Cervical Discectomy Fusion - Cervical four-Cervical five;  Surgeon: Eustace Moore, MD;  Location: [facility];  Service: Neurosurgery;  Laterality: N/A;  . APPENDECTOMY    . CHOLECYSTECTOMY N/A 11/22/2012   Procedure: LAPAROSCOPIC CHOLECYSTECTOMY WITH INTRAOPERATIVE CHOLANGIOGRAM;  Surgeon: Shann Medal, MD;  Location: [facility];  Service: General;  Laterality: N/A;  . HAND SURGERY    . NECK SURGERY       OB History   None      Home Medications    Prior to Admission medications   Medication Sig Start Date End Date Taking? Authorizing Provider  lisinopril (PRINIVIL,ZESTRIL) 20 MG tablet Take 20 mg by mouth daily. 09/24/15  Yes [provider]  Multiple Vitamin (MULTIVITAMIN  WITH MINERALS) TABS tablet Take 1 tablet by mouth daily.   Yes [provider]  cyclobenzaprine (FLEXERIL) 10 MG tablet Take 1 tablet (10 mg total) by mouth 2 (two) times daily as needed for muscle spasms. 02/02/18   Dorie Rank, MD  methocarbamol (ROBAXIN) 500 MG tablet Take 1 tablet (500 mg total) by mouth every 6 (six) hours as needed for muscle spasms. Patient not taking: Reported on 02/02/2018 04/23/16   Eustace Moore, MD  naproxen (NAPROSYN) 500 MG tablet Take 1 tablet (500 mg total) by mouth 2 (two) times daily. 02/02/18   Dorie Rank, MD  oxyCODONE-acetaminophen (PERCOCET/ROXICET) 5-325 MG tablet Take 1-2 tablets by mouth every 6 (six) hours as needed for moderate pain. Patient not taking: Reported on 02/02/2018 04/23/16   Eustace Moore, MD    Family History No family history on file.  Social History Social History   Tobacco Use  . Smoking status: Never Smoker  . Smokeless tobacco: Never Used  Substance Use Topics  . Alcohol use: No  . Drug use: No     Allergies   Codeine   Review of Systems Review of Systems  All other systems reviewed and are negative.    Physical Exam Updated Vital Signs BP 131/90 (BP Location: Right Arm)   Pulse 75   Temp 98 F (36.7 C) (Oral)  Resp 18   SpO2 100%   Physical Exam  Constitutional: She appears well-developed and well-nourished. No distress.  HENT:  Head: Normocephalic and atraumatic.  Right Ear: External ear normal.  Left Ear: External ear normal.  Eyes: Conjunctivae are normal. Right eye exhibits no discharge. Left eye exhibits no discharge. No scleral icterus.  Neck: Neck supple. No tracheal deviation present.  Cardiovascular: Normal rate.  Pulmonary/Chest: Effort normal. No stridor. No respiratory distress.  Abdominal: She exhibits no distension.  Musculoskeletal: She exhibits no edema.       Lumbar back: She exhibits tenderness. She exhibits no swelling, no edema and no deformity.  Neurological: She is  alert. Cranial nerve deficit: no gross deficits.  Skin: Skin is warm and dry. No rash noted.  Psychiatric: She has a normal mood and affect.  Nursing note and vitals reviewed.    ED Treatments / Results  Labs (all labs ordered are listed, but only abnormal results are displayed) Labs Reviewed  URINALYSIS, ROUTINE W REFLEX MICROSCOPIC - Abnormal; Notable for the following components:      Result Value   Hgb urine dipstick SMALL (*)    Bacteria, UA RARE (*)    All other components within normal limits  PREGNANCY, URINE    EKG None  Radiology No results found.  Procedures Procedures (including critical care time)  Medications Ordered in ED Medications  ibuprofen (ADVIL,MOTRIN) tablet 600 mg (600 mg Oral Given 02/02/18 0855)     Initial Impression / Assessment and Plan / ED Course  I have reviewed the triage vital signs and the nursing notes.  Pertinent labs & imaging results that were available during my care of the patient were reviewed by me and considered in my medical decision making (see chart for details).    Patient presented to the emergency room for evaluation of low back pain and with concerns with her blood pressure.  Patient's back pain appears to be musculoskeletal in nature.  She does not have any numbness or weakness.  No clear radicular symptoms.  Patient had some urinary discomfort but urinalysis does not suggest urinary tract infection or kidney stone and her pain is not really suggestive of ureteral colic or pyelonephritis.  Patient was concerned about her blood pressure being elevated earlier this week but here it is essentially normal.  I am not seeing any symptoms to suggest any acute complications associated with hypertension.  I suggested she keep a record of her blood pressure and follow-up with her primary care doctor.  Final Clinical Impressions(s) / ED Diagnoses   Final diagnoses:  Strain of lumbar region, initial encounter    ED Discharge  Orders         Ordered    naproxen (NAPROSYN) 500 MG tablet  2 times daily     02/02/18 1006    cyclobenzaprine (FLEXERIL) 10 MG tablet  2 times daily PRN     02/02/18 1006           Dorie Rank, MD 02/02/18 1013

## 2018-02-02 NOTE — ED Triage Notes (Signed)
Pt states her BP has been elevated all week and complains of back pain that feels like she is in labor. Pt has tried heating pads w/o relief. Pt states her BP has gotten a s high as 178/56. Pt has been taking her BP medications. Pt states she has been unable to go to work.

## 2018-02-02 NOTE — Discharge Instructions (Signed)
Take the medications as needed for pain, follow up with your doctor if not getting better in the next week, monitor for fever, numbness, weakness, worsening symptoms

## 2018-06-14 ENCOUNTER — Emergency Department (HOSPITAL_COMMUNITY)
Admission: EM | Admit: 2018-06-14 | Discharge: 2018-06-14 | Disposition: A | Discharge status: Home / Self Care | Payer: BLUE CROSS/BLUE SHIELD | Attending: Emergency Medicine | Admitting: Emergency Medicine

## 2018-06-14 ENCOUNTER — Emergency Department (HOSPITAL_COMMUNITY): Payer: BLUE CROSS/BLUE SHIELD

## 2018-06-14 ENCOUNTER — Encounter (HOSPITAL_COMMUNITY): Payer: Self-pay | Admitting: Emergency Medicine

## 2018-06-14 DIAGNOSIS — S39012A Strain of muscle, fascia and tendon of lower back, initial encounter: Secondary | ICD-10-CM | POA: Diagnosis not present

## 2018-06-14 DIAGNOSIS — M79605 Pain in left leg: Secondary | ICD-10-CM | POA: Insufficient documentation

## 2018-06-14 DIAGNOSIS — S299XXA Unspecified injury of thorax, initial encounter: Secondary | ICD-10-CM | POA: Diagnosis present

## 2018-06-14 DIAGNOSIS — Z79899 Other long term (current) drug therapy: Secondary | ICD-10-CM | POA: Insufficient documentation

## 2018-06-14 DIAGNOSIS — Y999 Unspecified external cause status: Secondary | ICD-10-CM | POA: Insufficient documentation

## 2018-06-14 DIAGNOSIS — X58XXXA Exposure to other specified factors, initial encounter: Secondary | ICD-10-CM | POA: Diagnosis not present

## 2018-06-14 DIAGNOSIS — Y939 Activity, unspecified: Secondary | ICD-10-CM | POA: Diagnosis not present

## 2018-06-14 DIAGNOSIS — I10 Essential (primary) hypertension: Secondary | ICD-10-CM | POA: Insufficient documentation

## 2018-06-14 DIAGNOSIS — Y929 Unspecified place or not applicable: Secondary | ICD-10-CM | POA: Diagnosis not present

## 2018-06-14 LAB — URINALYSIS, ROUTINE W REFLEX MICROSCOPIC
BILIRUBIN URINE: NEGATIVE
GLUCOSE, UA: NEGATIVE mg/dL
KETONES UR: NEGATIVE mg/dL
LEUKOCYTES UA: NEGATIVE
NITRITE: NEGATIVE
PH: 5 (ref 5.0–8.0)
Protein, ur: NEGATIVE mg/dL
SPECIFIC GRAVITY, URINE: 1.015 (ref 1.005–1.030)

## 2018-06-14 MED ORDER — MELOXICAM 7.5 MG PO TABS: 7.5000 mg | ORAL_TABLET | Freq: Every day | ORAL | 0 refills | Status: AC

## 2018-06-14 MED ORDER — KETOROLAC TROMETHAMINE 15 MG/ML IJ SOLN
15.0000 mg | Freq: Once | INTRAMUSCULAR | Status: AC
  Administered 2018-06-14: 15 mg via INTRAMUSCULAR
  Filled 2018-06-14: qty 1

## 2018-06-14 MED ORDER — METHYLPREDNISOLONE ACETATE 80 MG/ML IJ SUSP
80.0000 mg | Freq: Once | INTRAMUSCULAR | Status: AC
  Administered 2018-06-14: 80 mg via INTRAMUSCULAR
  Filled 2018-06-14: qty 1

## 2018-06-14 MED ORDER — LIDOCAINE 5 % EX PTCH
1.0000 | MEDICATED_PATCH | CUTANEOUS | Status: DC
  Administered 2018-06-14: 1 via TRANSDERMAL
  Filled 2018-06-14: qty 1

## 2018-06-14 MED ORDER — ORPHENADRINE CITRATE ER 100 MG PO TB12: 100.0000 mg | ORAL_TABLET | Freq: Two times a day (BID) | ORAL | 0 refills | Status: AC

## 2018-06-14 NOTE — ED Provider Notes (Signed)
[facility] DEPT Provider Note   CSN: [id number] Arrival date & time: 06/14/18  1431     History   Chief Complaint Chief Complaint  Patient presents with  . Back Pain  . Foot Swelling  . Leg Pain    HPI Amber Cuevas is a 48 y.o. female.  48 year old female presents with low back pain x3 days also with pain in her left lateral lower leg.  Patient denies falls, injuries, abdominal pain, loss of bowel or bladder control, groin numbness.  Patient has been taking 800 mg ibuprofen with limited relief.  Pain is worse with movement, improves somewhat with rest however still in pain.  Low back pain does not radiate.  No history of prior back problems.  Pain in the left lower lateral leg without history of recent injury, worse with palpation, denies calf pain.  No other complaints or concerns.     Past Medical History:  Diagnosis Date  . Hypertension     Patient Active Problem List   Diagnosis Date Noted  . S/P cervical spinal fusion 04/22/2016  . Lipoma of back 09/19/2013  . Gallstones 10/30/2012    Past Surgical History:  Procedure Laterality Date  . ABDOMINAL HYSTERECTOMY    . ANTERIOR CERVICAL DECOMP/DISCECTOMY FUSION N/A 04/22/2016   Procedure: Anterior Cervical Discectomy Fusion - Cervical four-Cervical five;  Surgeon: Eustace Moore, MD;  Location: [facility];  Service: Neurosurgery;  Laterality: N/A;  . APPENDECTOMY    . CHOLECYSTECTOMY N/A 11/22/2012   Procedure: LAPAROSCOPIC CHOLECYSTECTOMY WITH INTRAOPERATIVE CHOLANGIOGRAM;  Surgeon: Shann Medal, MD;  Location[facility];  Service: General;  Laterality: N/A;  . HAND SURGERY    . NECK SURGERY       OB History   No obstetric history on file.      Home Medications    Prior to Admission medications   Medication Sig Start Date End Date Taking? Authorizing Provider  lisinopril (PRINIVIL,ZESTRIL) 20 MG tablet Take 20 mg by mouth daily. 09/24/15  Yes [provider]  Multiple  Vitamin (MULTIVITAMIN WITH MINERALS) TABS tablet Take 1 tablet by mouth daily.   Yes [provider]  meloxicam (MOBIC) 7.5 MG tablet Take 1 tablet (7.5 mg total) by mouth daily for 10 days. 06/14/18 06/24/18  Tacy Learn, PA-C  orphenadrine (NORFLEX) 100 MG tablet Take 1 tablet (100 mg total) by mouth 2 (two) times daily for 10 days. 06/14/18 06/24/18  Tacy Learn, PA-C    Family History No family history on file.  Social History Social History   Tobacco Use  . Smoking status: Never Smoker  . Smokeless tobacco: Never Used  Substance Use Topics  . Alcohol use: No  . Drug use: No     Allergies   Patient has no active allergies.   Review of Systems Review of Systems  Constitutional: Negative for fever.  Gastrointestinal: Negative for abdominal pain, constipation, diarrhea, nausea and vomiting.  Genitourinary: Negative for decreased urine volume, difficulty urinating, dysuria, frequency and urgency.  Musculoskeletal: Positive for back pain and myalgias.  Skin: Negative for color change, rash and wound.  Allergic/Immunologic: Negative for immunocompromised state.  Neurological: Negative for seizures and weakness.  Psychiatric/Behavioral: Negative for confusion.  All other systems reviewed and are negative.    Physical Exam Updated Vital Signs BP 140/89 (BP Location: Right Arm)   Pulse 80   Temp 98.5 F (36.9 C) (Oral)   Resp 18   Ht 5\' 4"  (1.626 m)  Wt 88 kg   SpO2 100%   BMI 33.30 kg/m   Physical Exam Vitals signs and nursing note reviewed.  Constitutional:      General: She is not in acute distress.    Appearance: Normal appearance. She is well-developed. She is obese. She is not diaphoretic.  HENT:     Head: Normocephalic and atraumatic.  Cardiovascular:     Pulses: Normal pulses.  Pulmonary:     Effort: Pulmonary effort is normal.  Abdominal:     Tenderness: There is no abdominal tenderness.  Musculoskeletal:        General: Tenderness  present. No swelling, deformity or signs of injury.     Left knee: Normal.     Lumbar back: She exhibits decreased range of motion and tenderness. She exhibits no bony tenderness.       Back:     Right lower leg: No edema.     Left lower leg: She exhibits tenderness. No edema.       Legs:     Comments: No calf swelling, negative homans, TTP left lateral lower leg  Skin:    General: Skin is warm and dry.     Findings: No erythema or rash.  Neurological:     Mental Status: She is alert and oriented to person, place, and time.     Deep Tendon Reflexes:     Reflex Scores:      Patellar reflexes are 1+ on the right side and 1+ on the left side.      Achilles reflexes are 1+ on the right side and 1+ on the left side.    Comments: Equal leg and great toe strength  Psychiatric:        Behavior: Behavior normal.      ED Treatments / Results  Labs (all labs ordered are listed, but only abnormal results are displayed) Labs Reviewed  URINALYSIS, ROUTINE W REFLEX MICROSCOPIC - Abnormal; Notable for the following components:      Result Value   Hgb urine dipstick SMALL (*)    Bacteria, UA RARE (*)    All other components within normal limits    EKG None  Radiology Dg Lumbar Spine Complete  Result Date: 06/14/2018 CLINICAL DATA:  Left-sided low back pain for the past 3 days. No injury. EXAM: LUMBAR SPINE - COMPLETE 4+ VIEW COMPARISON:  CT abdomen pelvis dated November 21, 2015. FINDINGS: Five lumbar type vertebral bodies. No acute fracture or subluxation. Vertebral body heights are preserved. Unchanged L2 hemangioma. Alignment is normal. Intervertebral disc spaces are maintained. The sacroiliac joints are unremarkable. IMPRESSION: Negative. Electronically Signed   By: Titus Dubin M.D.   On: 06/14/2018 18:49   Dg Tibia/fibula Left  Result Date: 06/14/2018 CLINICAL DATA:  Left lower leg pain for the past 3 days.  No injury. EXAM: LEFT TIBIA AND FIBULA - 2 VIEW COMPARISON:  None. FINDINGS:  There is no evidence of fracture or other focal bone lesions. Soft tissues are unremarkable. IMPRESSION: Negative. Electronically Signed   By: Titus Dubin M.D.   On: 06/14/2018 18:49    Procedures Procedures (including critical care time)  Medications Ordered in ED Medications  lidocaine (LIDODERM) 5 % 1 patch (1 patch Transdermal Patch Applied 06/14/18 1807)  ketorolac (TORADOL) 15 MG/ML injection 15 mg (15 mg Intramuscular Given 06/14/18 1806)  methylPREDNISolone acetate (DEPO-MEDROL) injection 80 mg (80 mg Intramuscular Given 06/14/18 1846)     Initial Impression / Assessment and Plan / ED Course  I  have reviewed the triage vital signs and the nursing notes.  Pertinent labs & imaging results that were available during my care of the patient were reviewed by me and considered in my medical decision making (see chart for details).  Clinical Course as of Jun 14 2041  Thu Jun 14, 4733  335 48 year old female with low back pain without injury, reproduced with palpation.  Also with left lower leg pain, reproduced with palpation of the lateral left lower leg, no calf swelling or tenderness.  X-ray of the lumbar spine and left lower leg without acute findings, x-ray lumbar spine shows L2 hemangioma seen on previous study.  Patient was given Lidoderm patch with injection of Toradol and Depo-Medrol, advised to follow-up with her PCP, given prescription for meloxicam and Norflex.  Return to ER for worsening or concerning symptoms.   [LM]    Clinical Course User Index [LM] Tacy Learn, PA-C   Final Clinical Impressions(s) / ED Diagnoses   Final diagnoses:  Strain of lumbar region, initial encounter  Left leg pain    ED Discharge Orders         Ordered    meloxicam (MOBIC) 7.5 MG tablet  Daily     06/14/18 2040    orphenadrine (NORFLEX) 100 MG tablet  2 times daily     06/14/18 2040           Roque Lias 06/14/18 2043    Drenda Freeze, MD 06/14/18 [phone]

## 2018-06-14 NOTE — ED Notes (Signed)
Graham crackers given to pt.

## 2018-06-14 NOTE — ED Triage Notes (Signed)
Pt c/o lower back pains, bilat feet swelling and left lower leg pains for 3 days. Denies any falls or injuries. Taking ibuprofen 800mg  trying to help with pains.

## 2018-06-14 NOTE — Discharge Instructions (Addendum)
Follow-up with your primary care provider.  Return to ER for new or worsening symptoms. Take meloxicam as prescribed daily, do not take any other NSAIDs while taking meloxicam. Take Norflex as needed, do not drive if taking Norflex. Apply topical lidocaine patches, these are available over-the-counter.

## 2018-09-05 ENCOUNTER — Other Ambulatory Visit: Payer: Self-pay

## 2018-09-05 ENCOUNTER — Emergency Department (HOSPITAL_COMMUNITY): Payer: BLUE CROSS/BLUE SHIELD

## 2018-09-05 ENCOUNTER — Emergency Department (HOSPITAL_COMMUNITY)
Admission: EM | Admit: 2018-09-05 | Discharge: 2018-09-06 | Disposition: A | Discharge status: Home / Self Care | Payer: BLUE CROSS/BLUE SHIELD | Attending: Emergency Medicine | Admitting: Emergency Medicine

## 2018-09-05 ENCOUNTER — Encounter (HOSPITAL_COMMUNITY): Payer: Self-pay

## 2018-09-05 DIAGNOSIS — G8929 Other chronic pain: Secondary | ICD-10-CM

## 2018-09-05 DIAGNOSIS — Z79899 Other long term (current) drug therapy: Secondary | ICD-10-CM | POA: Diagnosis not present

## 2018-09-05 DIAGNOSIS — D1809 Hemangioma of other sites: Secondary | ICD-10-CM | POA: Insufficient documentation

## 2018-09-05 DIAGNOSIS — I10 Essential (primary) hypertension: Secondary | ICD-10-CM | POA: Insufficient documentation

## 2018-09-05 DIAGNOSIS — R109 Unspecified abdominal pain: Secondary | ICD-10-CM | POA: Insufficient documentation

## 2018-09-05 DIAGNOSIS — M545 Low back pain, unspecified: Secondary | ICD-10-CM

## 2018-09-05 DIAGNOSIS — R350 Frequency of micturition: Secondary | ICD-10-CM | POA: Insufficient documentation

## 2018-09-05 LAB — URINALYSIS, ROUTINE W REFLEX MICROSCOPIC
Bilirubin Urine: NEGATIVE
Glucose, UA: NEGATIVE mg/dL
KETONES UR: NEGATIVE mg/dL
Leukocytes,Ua: NEGATIVE
NITRITE: NEGATIVE
PROTEIN: NEGATIVE mg/dL
Specific Gravity, Urine: 1.014 (ref 1.005–1.030)
pH: 5 (ref 5.0–8.0)

## 2018-09-05 MED ORDER — KETOROLAC TROMETHAMINE 30 MG/ML IJ SOLN
30.0000 mg | Freq: Once | INTRAMUSCULAR | Status: AC
  Administered 2018-09-05: 30 mg via INTRAVENOUS
  Filled 2018-09-05: qty 1

## 2018-09-05 MED ORDER — CYCLOBENZAPRINE HCL 10 MG PO TABS: 10.0000 mg | ORAL_TABLET | Freq: Two times a day (BID) | ORAL | 0 refills | Status: DC | PRN

## 2018-09-05 MED ORDER — ACETAMINOPHEN 500 MG PO TABS
1000.0000 mg | ORAL_TABLET | Freq: Once | ORAL | Status: AC
  Administered 2018-09-05: 1000 mg via ORAL
  Filled 2018-09-05: qty 2

## 2018-09-05 MED ORDER — METHOCARBAMOL 1000 MG/10ML IJ SOLN
1000.0000 mg | Freq: Once | INTRAVENOUS | Status: AC
  Administered 2018-09-05: 1000 mg via INTRAVENOUS
  Filled 2018-09-05: qty 10

## 2018-09-05 MED ORDER — NAPROXEN 500 MG PO TBEC: 500.0000 mg | DELAYED_RELEASE_TABLET | Freq: Two times a day (BID) | ORAL | 0 refills | Status: AC

## 2018-09-05 MED ORDER — METHOCARBAMOL 1000 MG/10ML IJ SOLN: 1000.0000 mg | Freq: Once | INTRAMUSCULAR | Status: DC

## 2018-09-05 NOTE — Discharge Instructions (Addendum)
You you were seen in the emergency department for back pain. Urinalysis, lab work, and CT were obtained.  CT showed hemangioma of vertebra of lumbar spine.  This is not a new finding and has been noted in previous imaging.  CT showed tiny non obstructive stable stones in right kidney tissue.  I suspect your pain is either from a muscular overuse injury, spasm or possibly nerve inflammation.   We will treat this with a combination of anti-inflammatories, muscle relaxers, stretches, rest. 1000 mg of acetaminophen (Tylenol) every 6 hours for the next 5 days 500 mg of naproxen every 12 hours for the next 5 days 10 mg cyclobenzaprine (Flexeril) every 12 hours for the next 5 days for associated spasms and tightness Rest for the next 48 hours to avoid further injury Transition back into daily activity slowly to avoid reinjury Over-the-counter lidocaine-containing patches can be applied to the area of pain for 12 hours at a time  Return to the ER if your pain worsens, you develop abdominal pain, urinary symptoms, changes to your bowel movements, numbness in your groin, loss of bladder or bowel control, loss of sensation or heaviness or weakness to your extremities, fevers.

## 2018-09-05 NOTE — ED Provider Notes (Signed)
[facility] DEPT Provider Note   CSN: [id number] Arrival date & time: 09/05/18  2046    History   Chief Complaint Chief Complaint  Patient presents with   Back Pain    HPI Amber Cuevas is a 48 y.o. female with h/o HTN, obesity, sciatica, cervical stenosis s/p ACDF 2017, pyelonephritis, hysterectomy, here for evaluation of right flank pain with intermittent radiation to right lumbar spine.  Onset 2.5 weeks ago, gradually worsening over the last 48 hours. Described as severe, constantly throbbing but intermittently sharp shooting.  Pain is now constant, nothing makes it better or worse.  Moving or palpation does not make it worse. Has taken ibuprofen, Tylenol, Aleve without relief.  She is now afraid she is taking too much of this.  Laying on a heating pad provides mild temporary relief.  Chart shows she has been seen in the ED for right lumbar/flank pain in the past, she states her sciatica feels similar but less severe and usually with radiation to the leg but states her pain today does not radiate to her lower extremity.  Associated with urinary frequency.  She denies any falls, exertional activity, lifting.  No history of back surgeries, recent injections.  Denies associated fever, abdominal pain, dysuria, hematuria, changes to bowel movements, saddle anesthesia, loss of bladder or bowel control, loss of sensation paresthesias or weakness to extremities. HPI  Past Medical History:  Diagnosis Date   Hypertension     Patient Active Problem List   Diagnosis Date Noted   S/P cervical spinal fusion 04/22/2016   Lipoma of back 09/19/2013   Gallstones 10/30/2012    Past Surgical History:  Procedure Laterality Date   ABDOMINAL HYSTERECTOMY     ANTERIOR CERVICAL DECOMP/DISCECTOMY FUSION N/A 04/22/2016   Procedure: Anterior Cervical Discectomy Fusion - Cervical four-Cervical five;  Surgeon: Eustace Moore, MD;  Location: [facility];  Service: Neurosurgery;   Laterality: N/A;   APPENDECTOMY     CHOLECYSTECTOMY N/A 11/22/2012   Procedure: LAPAROSCOPIC CHOLECYSTECTOMY WITH INTRAOPERATIVE CHOLANGIOGRAM;  Surgeon: Shann Medal, MD;  Location: [facility];  Service: General;  Laterality: N/A;   HAND SURGERY     NECK SURGERY       OB History   No obstetric history on file.      Home Medications    Prior to Admission medications   Medication Sig Start Date End Date Taking? Authorizing Provider  lisinopril (PRINIVIL,ZESTRIL) 20 MG tablet Take 20 mg by mouth daily. 09/24/15  Yes [provider]  cyclobenzaprine (FLEXERIL) 10 MG tablet Take 1 tablet (10 mg total) by mouth 2 (two) times daily as needed for muscle spasms. 09/05/18   Kinnie Feil, PA-C  Multiple Vitamin (MULTIVITAMIN WITH MINERALS) TABS tablet Take 1 tablet by mouth daily.    [provider]  naproxen (EC NAPROSYN) 500 MG EC tablet Take 1 tablet (500 mg total) by mouth 2 (two) times daily with a meal for 15 days. 09/05/18 09/20/18  Kinnie Feil, PA-C    Family History History reviewed. No pertinent family history.  Social History Social History   Tobacco Use   Smoking status: Never Smoker   Smokeless tobacco: Never Used  Substance Use Topics   Alcohol use: No   Drug use: No     Allergies   Patient has no known allergies.   Review of Systems Review of Systems  Genitourinary: Positive for flank pain and frequency.  Musculoskeletal: Positive for back pain.  All other systems  reviewed and are negative.    Physical Exam Updated Vital Signs BP (!) 150/98 (BP Location: Left Arm)    Pulse 84    Temp 97.6 F (36.4 C) (Oral)    Resp 20    Ht 5\' 3"  (1.6 m)    Wt 79.4 kg    SpO2 100%    BMI 31.00 kg/m   Physical Exam Vitals signs and nursing note reviewed.  Constitutional:      General: She is not in acute distress.    Appearance: She is well-developed.     Comments: Teary eyed, moving around in bed.   HENT:     Head: Normocephalic and  atraumatic.     Nose: Nose normal.  Neck:     Musculoskeletal: Normal range of motion.  Cardiovascular:     Rate and Rhythm: Normal rate.     Pulses:          Radial pulses are 2+ on the right side and 2+ on the left side.       Dorsalis pedis pulses are 2+ on the right side and 2+ on the left side.     Heart sounds: Normal heart sounds.  Pulmonary:     Effort: Pulmonary effort is normal.     Breath sounds: Normal breath sounds.  Abdominal:     Palpations: Abdomen is soft.     Tenderness: There is abdominal tenderness.     Comments: Mild low R CVAT. No suprapubic or L CVAT.   Musculoskeletal:        General: No tenderness.     Lumbar back: She exhibits pain.     Comments:  T-spine: no midline or paraspinal tenderness. No reproducible muscular tenderness over CVA. Pain reported throughout entire exam, not worse with movement. L-spine: no midline or paraspinal tenderness.  No SI or sciatic notch tenderness. Negative SLR bilaterally.  Pelvis: no pain or crepitus with IR/ER/downward pressure of hips bilaterally.   Skin:    General: Skin is warm and dry.     Capillary Refill: Capillary refill takes less than 2 seconds.     Comments: No overlaying rash to back  Neurological:     Mental Status: She is alert.     Sensory: No sensory deficit.     Comments: Can lift and hold legs without unilateral weakness or drift 5/5 strength with flexion/extension of hip, knee and ankle, bilaterally.  Sensation to light touch intact in lower extremities including feet  Psychiatric:        Speech: Speech normal.        Behavior: Behavior normal.        Thought Content: Thought content normal.      ED Treatments / Results  Labs (all labs ordered are listed, but only abnormal results are displayed) Labs Reviewed  URINALYSIS, ROUTINE W REFLEX MICROSCOPIC - Abnormal; Notable for the following components:      Result Value   Color, Urine STRAW (*)    Hgb urine dipstick SMALL (*)    Bacteria, UA  RARE (*)    All other components within normal limits  CBC WITH DIFFERENTIAL/PLATELET  BASIC METABOLIC PANEL    EKG None  Radiology Ct Renal Stone Study  Result Date: 09/05/2018 CLINICAL DATA:  Initial evaluation for acute right lower back pain/right flank pain, stone disease suspected. EXAM: CT ABDOMEN AND PELVIS WITHOUT CONTRAST TECHNIQUE: Multidetector CT imaging of the abdomen and pelvis was performed following the standard protocol without IV contrast. COMPARISON:  Prior  CT from 11/21/2015. FINDINGS: Lower chest: Visualized lung bases are clear. Hepatobiliary: Limited noncontrast evaluation of the liver is unremarkable. Gallbladder surgically absent. No biliary dilatation. Pancreas: Pancreas within normal limits. Spleen: Spleen within normal limits. Adrenals/Urinary Tract: Adrenal glands are normal. Kidneys are equal in size. Probable faint nonobstructive 2 mm stone noted at the upper pole of the right kidney (series 5, image 105). No other definite renal calculi seen within either kidney. No other stones seen along the course of either renal collecting system. No hydronephrosis. Bladder is largely decompressed without acute finding. No layering stones within the bladder lumen. Stomach/Bowel: Stomach within normal limits. No evidence for bowel obstruction. No acute inflammatory changes seen about the bowels. Appendix not visualize, compatible with history of prior appendectomy. Vascular/Lymphatic: Intra- abdominal aorta of normal caliber. Minimal scattered atherosclerotic change. No adenopathy. Reproductive: Uterus is absent.  Ovaries not discretely identified. Other: No free air or fluid. Small fat containing paraumbilical hernia without associated inflammation. Musculoskeletal: No acute osseous finding. No discrete lytic or blastic osseous lesions. Prominent hemangioma noted within the L2 vertebral body. IMPRESSION: 1. Probable faint 2 mm nonobstructive right renal nephrolithiasis. No  ureterolithiasis or evidence for obstructive uropathy. 2. No other acute intra-abdominal or pelvic process. Electronically Signed   By: Jeannine Boga M.D.   On: 09/05/2018 23:01    Procedures Procedures (including critical care time)  Medications Ordered in ED Medications  ketorolac (TORADOL) 30 MG/ML injection 30 mg (30 mg Intravenous Given 09/05/18 2241)  acetaminophen (TYLENOL) tablet 1,000 mg (1,000 mg Oral Given 09/05/18 2235)  methocarbamol (ROBAXIN) 1,000 mg in dextrose 5 % 50 mL IVPB (1,000 mg Intravenous New Bag/Given 09/05/18 2245)     Initial Impression / Assessment and Plan / ED Course  I have reviewed the triage vital signs and the nursing notes.  Pertinent labs & imaging results that were available during my care of the patient were reviewed by me and considered in my medical decision making (see chart for details).  Clinical Course as of Sep 05 2326  Wed Sep 05, 2018  2305 Musculoskeletal: No acute osseous finding. No discrete lytic or blastic osseous lesions. Prominent hemangioma noted within the L2 vertebral body.  IMPRESSION: 1. Probable faint 2 mm nonobstructive right renal nephrolithiasis. No ureterolithiasis or evidence for obstructive uropathy. 2. No other acute intra-abdominal or pelvic process.  CT Renal Laren Everts [CG]    Clinical Course User Index [CG] Kinnie Feil, PA-C       Pt has been in ED for similar lumbar back pain so considering chronic MSK pain. She has h/o pyelonephritis, and reports urinary frequency.  Duration of 2-3 weeks makes renal colic/stone vs pyelonephritis less likely.  She is teary eyed and writing in bed. Afebrile. Atraumatic. No overlaying rash. No associated abdominal pain, n/v/d, pulsatility.  Afebrile. No red flag features of back pain present such as saddle anesthesia, bladder/bowel incontinence or retention, fevers, h/o cancer, IVDU, neuro deficits.  I have low suspicion for cauda equina, epidural abscess, dissection  as these don't fit clinical picture. Will obtain labs, UA, CT renal.    2308: CT renal with non obstructive small R nephrolithiasis, no ureteral stone, perinephric stranding or hydro. Chronic prominent hemangioma in L2 again noted which could be contributing, pt aware of this finding. Bowel, vessels, MSK otherwise unremarkable.  Pending UA and labs.  Anticipate discharge if remaining work up unremarkable.  Discussed plan to dc with naproxen, 1g tylenol, flexeril.  She has new appt with PCP next  month. Has been seen by Dr Ronnald Ramp with [facility] in the past and encouraged f/u for long term management of chronic back pain.  Final Clinical Impressions(s) / ED Diagnoses   Final diagnoses:  Chronic right-sided low back pain without sciatica  Hemangioma of vertebral body    ED Discharge Orders         Ordered    cyclobenzaprine (FLEXERIL) 10 MG tablet  2 times daily PRN     09/05/18 2315    naproxen (EC NAPROSYN) 500 MG EC tablet  2 times daily with meals     09/05/18 2315           Arlean Hopping 09/05/18 2328    Virgel Manifold, MD 09/06/18 2002

## 2018-09-05 NOTE — ED Triage Notes (Signed)
Pt reports R flank and back pain x2 weeks. Pt is in tears in triage. Denies difficulty urinating, but reports that she is going more frequently. A&Ox4.

## 2018-09-05 NOTE — ED Notes (Signed)
Called lab to ask about why blood sent was not being processed. Lab stated they could not find any blood for this pt.

## 2018-09-05 NOTE — ED Notes (Signed)
I-Stat Beta D/C due to pts htx of Hysterectomy

## 2018-09-06 LAB — URINE CULTURE: Culture: NO GROWTH

## 2018-09-06 LAB — BASIC METABOLIC PANEL
Anion gap: 7 (ref 5–15)
BUN: 17 mg/dL (ref 6–20)
CO2: 25 mmol/L (ref 22–32)
Calcium: 9.1 mg/dL (ref 8.9–10.3)
Chloride: 107 mmol/L (ref 98–111)
Creatinine, Ser: 0.81 mg/dL (ref 0.44–1.00)
GFR calc Af Amer: >60 mL/min (ref >60)
GFR calc non Af Amer: >60 mL/min (ref >60)
GLUCOSE: 94 mg/dL (ref 70–99)
POTASSIUM: 3.9 mmol/L (ref 3.5–5.1)
Sodium: 139 mmol/L (ref 135–145)

## 2018-09-06 LAB — CBC WITH DIFFERENTIAL/PLATELET
ABS IMMATURE GRANULOCYTES: 0.04 10*3/uL (ref 0.00–0.07)
BASOS PCT: 0 %
Basophils Absolute: 0 10*3/uL (ref 0.0–0.1)
EOS PCT: 2 %
Eosinophils Absolute: 0.2 10*3/uL (ref 0.0–0.5)
HEMATOCRIT: 39.2 % (ref 36.0–46.0)
HEMOGLOBIN: 12.2 g/dL (ref 12.0–15.0)
Immature Granulocytes: 0 %
LYMPHS PCT: 25 %
Lymphs Abs: 2.6 10*3/uL (ref 0.7–4.0)
MCH: 27.5 pg (ref 26.0–34.0)
MCHC: 31.1 g/dL (ref 30.0–36.0)
MCV: 88.5 fL (ref 80.0–100.0)
MONO ABS: 0.7 10*3/uL (ref 0.1–1.0)
Monocytes Relative: 7 %
NEUTROS ABS: 6.8 10*3/uL (ref 1.7–7.7)
Neutrophils Relative %: 66 %
Platelets: 244 10*3/uL (ref 150–400)
RBC: 4.43 MIL/uL (ref 3.87–5.11)
RDW: 13.9 % (ref 11.5–15.5)
WBC: 10.3 10*3/uL (ref 4.0–10.5)
nRBC: 0 % (ref 0.0–0.2)

## 2020-03-23 ENCOUNTER — Other Ambulatory Visit: Payer: Self-pay | Admitting: Internal Medicine

## 2020-03-23 ENCOUNTER — Other Ambulatory Visit: Payer: Self-pay

## 2020-03-23 ENCOUNTER — Ambulatory Visit
Admission: RE | Admit: 2020-03-23 | Discharge: 2020-03-23 | Disposition: A | Discharge status: Home / Self Care | Payer: BLUE CROSS/BLUE SHIELD | Source: Ambulatory Visit | Attending: Internal Medicine | Admitting: Internal Medicine

## 2020-03-23 DIAGNOSIS — Z1231 Encounter for screening mammogram for malignant neoplasm of breast: Secondary | ICD-10-CM

## 2020-04-17 ENCOUNTER — Ambulatory Visit: Payer: BLUE CROSS/BLUE SHIELD | Admitting: Family Medicine

## 2020-11-03 ENCOUNTER — Ambulatory Visit
Admission: RE | Admit: 2020-11-03 | Discharge: 2020-11-03 | Disposition: A | Discharge status: Home / Self Care | Payer: 59 | Source: Ambulatory Visit | Attending: Internal Medicine | Admitting: Internal Medicine

## 2020-11-03 ENCOUNTER — Other Ambulatory Visit: Payer: Self-pay | Admitting: Internal Medicine

## 2020-11-03 DIAGNOSIS — M546 Pain in thoracic spine: Secondary | ICD-10-CM

## 2020-11-03 DIAGNOSIS — M79601 Pain in right arm: Secondary | ICD-10-CM

## 2021-01-27 ENCOUNTER — Ambulatory Visit
Admission: RE | Admit: 2021-01-27 | Discharge: 2021-01-27 | Disposition: A | Discharge status: Home / Self Care | Payer: 59 | Source: Ambulatory Visit | Attending: Internal Medicine | Admitting: Internal Medicine

## 2021-01-27 ENCOUNTER — Other Ambulatory Visit: Payer: Self-pay

## 2021-01-27 ENCOUNTER — Other Ambulatory Visit: Payer: Self-pay | Admitting: Internal Medicine

## 2021-01-27 DIAGNOSIS — R059 Cough, unspecified: Secondary | ICD-10-CM

## 2021-01-27 DIAGNOSIS — Z8616 Personal history of COVID-19: Secondary | ICD-10-CM

## 2021-02-01 ENCOUNTER — Other Ambulatory Visit: Payer: Self-pay | Admitting: Internal Medicine

## 2021-02-01 ENCOUNTER — Ambulatory Visit
Admission: RE | Admit: 2021-02-01 | Discharge: 2021-02-01 | Disposition: A | Discharge status: Home / Self Care | Payer: 59 | Source: Ambulatory Visit | Attending: Internal Medicine | Admitting: Internal Medicine

## 2021-02-01 DIAGNOSIS — R059 Cough, unspecified: Secondary | ICD-10-CM

## 2021-02-01 DIAGNOSIS — R7989 Other specified abnormal findings of blood chemistry: Secondary | ICD-10-CM

## 2021-02-01 DIAGNOSIS — I200 Unstable angina: Secondary | ICD-10-CM

## 2021-02-01 DIAGNOSIS — Z8616 Personal history of COVID-19: Secondary | ICD-10-CM

## 2021-02-01 MED ORDER — IOPAMIDOL (ISOVUE-370) INJECTION 76%
75.0000 mL | Freq: Once | INTRAVENOUS | Status: AC | PRN
  Administered 2021-02-01: 75 mL via INTRAVENOUS

## 2021-03-03 ENCOUNTER — Other Ambulatory Visit: Payer: Self-pay | Admitting: Internal Medicine

## 2021-03-03 DIAGNOSIS — Z1231 Encounter for screening mammogram for malignant neoplasm of breast: Secondary | ICD-10-CM

## 2021-04-01 ENCOUNTER — Other Ambulatory Visit: Payer: Self-pay

## 2021-04-01 ENCOUNTER — Ambulatory Visit
Admission: RE | Admit: 2021-04-01 | Discharge: 2021-04-01 | Disposition: A | Discharge status: Home / Self Care | Payer: 59 | Source: Ambulatory Visit | Attending: Internal Medicine | Admitting: Internal Medicine

## 2021-04-01 DIAGNOSIS — Z1231 Encounter for screening mammogram for malignant neoplasm of breast: Secondary | ICD-10-CM

## 2021-08-10 DIAGNOSIS — Z1272 Encounter for screening for malignant neoplasm of vagina: Secondary | ICD-10-CM | POA: Diagnosis not present

## 2021-08-27 DIAGNOSIS — Z0000 Encounter for general adult medical examination without abnormal findings: Secondary | ICD-10-CM | POA: Diagnosis not present

## 2021-08-27 DIAGNOSIS — Z13220 Encounter for screening for lipoid disorders: Secondary | ICD-10-CM | POA: Diagnosis not present

## 2021-11-12 ENCOUNTER — Ambulatory Visit: Payer: 59 | Admitting: Podiatry

## 2021-11-12 ENCOUNTER — Other Ambulatory Visit: Payer: Self-pay | Admitting: Podiatry

## 2021-11-12 DIAGNOSIS — L600 Ingrowing nail: Secondary | ICD-10-CM | POA: Diagnosis not present

## 2021-11-12 DIAGNOSIS — Z79899 Other long term (current) drug therapy: Secondary | ICD-10-CM

## 2021-11-12 DIAGNOSIS — B351 Tinea unguium: Secondary | ICD-10-CM

## 2021-11-13 LAB — HEPATIC FUNCTION PANEL
ALT: 24 IU/L (ref 0–32)
AST: 19 IU/L (ref 0–40)
Albumin: 4.9 g/dL (ref 3.8–4.9)
Alkaline Phosphatase: 128 IU/L — ABNORMAL HIGH (ref 44–121)
Bilirubin Total: 0.6 mg/dL (ref 0.0–1.2)
Bilirubin, Direct: 0.15 mg/dL (ref 0.00–0.40)
Total Protein: 7.6 g/dL (ref 6.0–8.5)

## 2021-11-15 ENCOUNTER — Telehealth: Payer: Self-pay | Admitting: Podiatry

## 2021-11-15 MED ORDER — TERBINAFINE HCL 250 MG PO TABS: 250.0000 mg | ORAL_TABLET | Freq: Every day | ORAL | 0 refills | Status: DC

## 2021-11-15 NOTE — Telephone Encounter (Signed)
Pt called and states her medication was sent to the wrong pharmacy. She said she changed it with the nurse but it was still sent to the incorrect location. Her [employer] [facility]. [facility] and [facility]   Please advise. Thank you

## 2021-11-15 NOTE — Addendum Note (Signed)
Addended by: Boneta Lucks on: 11/15/2021 09:52 AM   Modules accepted: Orders

## 2021-11-16 MED ORDER — TERBINAFINE HCL 250 MG PO TABS: 250.0000 mg | ORAL_TABLET | Freq: Every day | ORAL | 0 refills | Status: DC

## 2021-11-16 NOTE — Addendum Note (Signed)
Addended by: Boneta Lucks on: 11/16/2021 08:13 AM   Modules accepted: Orders

## 2021-11-17 ENCOUNTER — Encounter: Payer: Self-pay | Admitting: Podiatry

## 2021-11-17 NOTE — Telephone Encounter (Signed)
Pharmacy has been updated and [facility] removed per patient's request. Patient notified that medication sent

## 2021-11-17 NOTE — Progress Notes (Signed)
Subjective:  Patient ID: Amber Cuevas, female    DOB: 11/19/1970,  MRN: [id number]  Chief Complaint  Patient presents with   Ingrown Toenail    Bilateral ingrown nail     51 y.o. female presents with the above complaint.  Patient presents with complaint bilateral hallux medial border ingrown.  Patient states painful to touch.  She states it hurts with ambulation.  She would like to have it removed.  She states she is tried soaking in warm water has not helped.  It is tender to touch.  She also has thickened elongated mycotic toenails x10.  She wanted to discuss treatment options for treating the nail fungus.  She has not seen anyone else prior to seeing me for nail fungus.  She has tried some over-the-counter medication which has not helped.   Review of Systems: Negative except as noted in the HPI. Denies N/V/F/Ch.  Past Medical History:  Diagnosis Date   Hypertension     Current Outpatient Medications:    cyclobenzaprine (FLEXERIL) 10 MG tablet, Take 1 tablet (10 mg total) by mouth 2 (two) times daily as needed for muscle spasms., Disp: 20 tablet, Rfl: 0   lisinopril (PRINIVIL,ZESTRIL) 20 MG tablet, Take 20 mg by mouth daily., Disp: , Rfl: 0   Multiple Vitamin (MULTIVITAMIN WITH MINERALS) TABS tablet, Take 1 tablet by mouth daily., Disp: , Rfl:    terbinafine (LAMISIL) 250 MG tablet, Take 1 tablet (250 mg total) by mouth daily., Disp: 90 tablet, Rfl: 0   terbinafine (LAMISIL) 250 MG tablet, Take 1 tablet (250 mg total) by mouth daily., Disp: 90 tablet, Rfl: 0  Social History   Tobacco Use  Smoking Status Never  Smokeless Tobacco Never    No Known Allergies Objective:  There were no vitals filed for this visit. There is no height or weight on file to calculate BMI. Constitutional Well developed. Well nourished.  Vascular Dorsalis pedis pulses palpable bilaterally. Posterior tibial pulses palpable bilaterally. Capillary refill normal to all digits.  No cyanosis or  clubbing noted. Pedal hair growth normal.  Neurologic Normal speech. Oriented to person, place, and time. Epicritic sensation to light touch grossly present bilaterally.  Dermatologic Painful ingrowing nail at medial nail borders of the hallux nail bilaterally. No other open wounds. No skin lesions.  Orthopedic: Normal joint ROM without pain or crepitus bilaterally. No visible deformities. No bony tenderness.   Radiographs: None Assessment:   1. Long-term use of high-risk medication   2. Onychomycosis due to dermatophyte   3. Ingrown toenail of right foot   4. Ingrown left big toenail    Plan:  Patient was evaluated and treated and all questions answered.  Ingrown Nail, bilaterally -Patient elects to proceed with minor surgery to remove ingrown toenail removal today. Consent reviewed and signed by patient. -Ingrown nail excised. See procedure note. -Educated on post-procedure care including soaking. Written instructions provided and reviewed. -Patient to follow up in 2 weeks for nail check.  Procedure: Excision of Ingrown Toenail Location: Bilateral 1st toe medial nail borders. Anesthesia: Lidocaine 1% plain; 1.5 mL and Marcaine 0.5% plain; 1.5 mL, digital block. Skin Prep: Betadine. Dressing: Silvadene; telfa; dry, sterile, compression dressing. Technique: Following skin prep, the toe was exsanguinated and a tourniquet was secured at the base of the toe. The affected nail border was freed, split with a nail splitter, and excised. Chemical matrixectomy was then performed with phenol and irrigated out with alcohol. The tourniquet was then removed and sterile dressing applied.  Disposition: Patient tolerated procedure well. Patient to return in 2 weeks for follow-up.   No follow-ups on file.

## 2021-12-16 DIAGNOSIS — Z03818 Encounter for observation for suspected exposure to other biological agents ruled out: Secondary | ICD-10-CM | POA: Diagnosis not present

## 2021-12-16 DIAGNOSIS — L509 Urticaria, unspecified: Secondary | ICD-10-CM | POA: Diagnosis not present

## 2021-12-16 DIAGNOSIS — J069 Acute upper respiratory infection, unspecified: Secondary | ICD-10-CM | POA: Diagnosis not present

## 2021-12-20 ENCOUNTER — Other Ambulatory Visit: Payer: Self-pay | Admitting: Internal Medicine

## 2021-12-20 ENCOUNTER — Ambulatory Visit
Admission: RE | Admit: 2021-12-20 | Discharge: 2021-12-20 | Disposition: A | Discharge status: Home / Self Care | Payer: 59 | Source: Ambulatory Visit | Attending: Internal Medicine | Admitting: Internal Medicine

## 2021-12-20 DIAGNOSIS — R059 Cough, unspecified: Secondary | ICD-10-CM

## 2021-12-20 DIAGNOSIS — R042 Hemoptysis: Secondary | ICD-10-CM

## 2022-03-08 ENCOUNTER — Other Ambulatory Visit: Payer: Self-pay | Admitting: Internal Medicine

## 2022-03-08 DIAGNOSIS — Z1231 Encounter for screening mammogram for malignant neoplasm of breast: Secondary | ICD-10-CM

## 2022-04-04 ENCOUNTER — Ambulatory Visit
Admission: RE | Admit: 2022-04-04 | Discharge: 2022-04-04 | Disposition: A | Discharge status: Home / Self Care | Payer: 59 | Source: Ambulatory Visit | Attending: Internal Medicine | Admitting: Internal Medicine

## 2022-04-04 DIAGNOSIS — Z1231 Encounter for screening mammogram for malignant neoplasm of breast: Secondary | ICD-10-CM

## 2022-07-19 DIAGNOSIS — R059 Cough, unspecified: Secondary | ICD-10-CM | POA: Diagnosis not present

## 2022-08-10 DIAGNOSIS — I10 Essential (primary) hypertension: Secondary | ICD-10-CM | POA: Diagnosis not present

## 2022-08-10 DIAGNOSIS — I700 Atherosclerosis of aorta: Secondary | ICD-10-CM | POA: Diagnosis not present

## 2022-08-10 DIAGNOSIS — R0601 Orthopnea: Secondary | ICD-10-CM | POA: Diagnosis not present

## 2022-08-10 DIAGNOSIS — R053 Chronic cough: Secondary | ICD-10-CM | POA: Diagnosis not present

## 2022-09-28 DIAGNOSIS — Z1272 Encounter for screening for malignant neoplasm of vagina: Secondary | ICD-10-CM | POA: Diagnosis not present

## 2022-09-28 DIAGNOSIS — R3915 Urgency of urination: Secondary | ICD-10-CM | POA: Diagnosis not present

## 2022-10-09 NOTE — Progress Notes (Unsigned)
Amber Cuevas, female    DOB: 1970/12/10    MRN: [id number]   Brief patient profile:  68  yobf never smoker  referred to pulmonary clinic in [place]  10/10/2022 by Dr Nehemiah Settle  for cough since URI (neg covid) July 2023   Neck surgeries by Dr Yetta Barre both times Ant approach   Allergy eval ? While on ACEi for face swelling pos for dust    History of Present Illness  10/10/2022  Pulmonary/ 1st office eval/ Amber Cuevas / [facility]  Chief Complaint  Patient presents with   Pulmonary Consult    Referred by Dr Nehemiah Settle. Pt states coughing for almost a year. She coughs up clear, foamy sputum- esp worse at night.   Dyspnea:  fine walking 3 miles at time s doe or cough  Cough: when head hits pillow clear mucus   Sleep: bed is level with a bunch of pillows  SABA use: not helping  02: none  Gen ant cp only with severe cough   No obvious day to day or daytime pattern/variability or assoc excess/ purulent sputum or mucus plugs or hemoptysis or  chest tightness, subjective wheeze or overt sinus or hb symptoms.     Also denies any obvious fluctuation of symptoms with weather or environmental changes or other aggravating or alleviating factors except as outlined above   No unusual exposure hx or h/o childhood pna/ asthma or knowledge of premature birth.  Current Allergies, Complete Past Medical History, Past Surgical History, Family History, and Social History were reviewed in [employer] record.  ROS  The following are not active complaints unless bolded Hoarseness, sore throat/globus dysphagia, dental problems, itching, sneezing,  nasal congestion or discharge of excess mucus or purulent secretions, ear ache,   fever, chills, sweats, unintended wt loss or wt gain, classically pleuritic or exertional cp,  orthopnea pnd or arm/hand swelling  or leg swelling, presyncope, palpitations, abdominal pain, anorexia, nausea, vomiting, diarrhea  or change in bowel habits or change in  bladder habits, change in stools or change in urine, dysuria, hematuria,  rash, arthralgias, visual complaints, headache, numbness, weakness or ataxia or problems with walking or coordination,  change in mood or  memory.               Past Medical History:  Diagnosis Date   Hypertension     Outpatient Medications Prior to Visit  Medication Sig Dispense Refill   amLODipine (NORVASC) 5 MG tablet Take 5 mg by mouth daily.     levocetirizine (XYZAL) 5 MG tablet Take 5 mg by mouth every evening.     Multiple Vitamin (MULTIVITAMIN WITH MINERALS) TABS tablet Take 1 tablet by mouth daily.     rosuvastatin (CRESTOR) 5 MG tablet Take 5 mg by mouth daily.     cyclobenzaprine (FLEXERIL) 10 MG tablet Take 1 tablet (10 mg total) by mouth 2 (two) times daily as needed for muscle spasms. 20 tablet 0          terbinafine (LAMISIL) 250 MG tablet Take 1 tablet (250 mg total) by mouth daily. 90 tablet 0   terbinafine (LAMISIL) 250 MG tablet Take 1 tablet (250 mg total) by mouth daily. 90 tablet 0       Objective:     BP 124/82 (BP Location: Left Arm, Cuff Size: Normal)   Pulse 87   Temp 97.8 F (36.6 C) (Oral)   Ht 5\' 3"  (1.6 m)   Wt 192 lb (87.1 kg)  SpO2 99% Comment: on RA  BMI 34.01 kg/m   SpO2: 99 % (on RA)    Pleasant amb bf nad no spont cough /  mod non-specific TE   HEENT : Oropharynx  clear     Nasal turbinates mod non specific edema   NECK :  without  apparent JVD/ palpable Nodes/TM    LUNGS: no acc muscle use,  Nl contour chest which is clear to A and P bilaterally without cough on insp or exp maneuvers   CV:  RRR  no s3 or murmur or increase in P2, and no edema   ABD: obese/  soft and nontender    MS:  Nl gait/ ext warm without deformities Or obvious joint restrictions  calf tenderness, cyanosis or clubbing    SKIN: warm and dry without lesions    NEURO:  alert, approp, nl sensorium with  no motor or cerebellar deficits apparent.   CXR PA and Lateral:   10/10/2022  :    I personally reviewed images and agree with radiology impression as follows:    No active dz    Assessment   Upper airway cough syndrome Onset July 2023 p viral uri s/p multiple ant cx neck surgery Dr Yetta Barre  - Allergy screen 10/10/2022 >  Eos 0. /  IgE   - 10/10/2022  cyclical cough protocol and f/u with Dr Lynelle Doctor at [facility] if not better   Upper airway cough syndrome (previously labeled PNDS),  is so named because it's frequently impossible to sort out how much is  CR/sinusitis with freq throat clearing (which can be related to primary GERD)   vs  causing  secondary (" extra esophageal")  GERD from wide swings in gastric pressure that occur with throat clearing, often  promoting self use of mint and menthol lozenges that reduce the lower esophageal sphincter tone and exacerbate the problem further in a cyclical fashion.   These are the same pts (now being labeled as having "irritable larynx syndrome" -which may have been triggered by 2 prev neck surgeries) by some cough centers) who not infrequently have a history of having failed to tolerate ace inhibitors,  dry powder inhalers or biphosphonates or report having atypical/extraesophageal reflux symptoms that don't respond to standard doses of PPI  and are easily confused as having aecopd or asthma flares by even experienced allergists/ pulmonologists (myself included).   Of the three most common causes of  Sub-acute / recurrent or chronic cough, only one (GERD)  can actually contribute to/ trigger  the other two (asthma and post nasal drip syndrome)  and perpetuate the cylce of cough.  While not intuitively obvious, many patients with chronic low grade reflux do not cough until there is a primary insult that disturbs the protective epithelial barrier and exposes sensitive nerve endings.   This is typically viral but can due to PNDS and  either may apply here.   The point is that once this occurs, it is difficult to eliminate the cycle  using  anything but a maximally effective acid suppression regimen at least in the short run, accompanied by an appropriate diet to address non acid GERD and control / eliminate the cough itself for at least 3 days with tramadol and >>> also so added 6 day taper off  Prednisone starting at 40 mg per day in case of component of Th-2 driven upper or lower airways inflammation (if cough responds short term only to relapse before return while will on full  rx for uacs (as above), then  that would point to allergic rhinitis/ asthma or eos bronchitis as alternative dx)   F/u can be prn in this clinic          Each maintenance medication was reviewed in detail including emphasizing most importantly the difference between maintenance and prns and under what circumstances the prns are to be triggered using an action plan format where appropriate.  Total time for H and P, chart review, counseling,  and generating customized AVS unique to this office visit / same day charting = 61 min new pt eval          Sandrea Hughs, MD 10/10/2022

## 2022-10-10 ENCOUNTER — Encounter: Payer: Self-pay | Admitting: Internal Medicine

## 2022-10-10 ENCOUNTER — Ambulatory Visit: Payer: 59 | Admitting: Internal Medicine

## 2022-10-10 ENCOUNTER — Ambulatory Visit (INDEPENDENT_AMBULATORY_CARE_PROVIDER_SITE_OTHER): Payer: 59

## 2022-10-10 VITALS — BP 124/82 | HR 87 | Temp 97.8°F | Ht 63.0 in | Wt 192.0 lb

## 2022-10-10 DIAGNOSIS — R058 Other specified cough: Secondary | ICD-10-CM | POA: Diagnosis not present

## 2022-10-10 DIAGNOSIS — J988 Other specified respiratory disorders: Secondary | ICD-10-CM

## 2022-10-10 LAB — CBC WITH DIFFERENTIAL/PLATELET
Basophils Absolute: 0 10*3/uL (ref 0.0–0.1)
Basophils Relative: 0.6 % (ref 0.0–3.0)
Eosinophils Absolute: 0.1 10*3/uL (ref 0.0–0.7)
Eosinophils Relative: 1.4 % (ref 0.0–5.0)
HCT: 41.3 % (ref 36.0–46.0)
Hemoglobin: 13.4 g/dL (ref 12.0–15.0)
Lymphocytes Relative: 23.9 % (ref 12.0–46.0)
Lymphs Abs: 1.6 10*3/uL (ref 0.7–4.0)
MCHC: 32.5 g/dL (ref 30.0–36.0)
MCV: 84.6 fl (ref 78.0–100.0)
Monocytes Absolute: 0.4 10*3/uL (ref 0.1–1.0)
Monocytes Relative: 5.2 % (ref 3.0–12.0)
Neutro Abs: 4.7 10*3/uL (ref 1.4–7.7)
Neutrophils Relative %: 68.9 % (ref 43.0–77.0)
Platelets: 278 10*3/uL (ref 150.0–400.0)
RBC: 4.88 Mil/uL (ref 3.87–5.11)
RDW: 14.9 % (ref 11.5–15.5)
WBC: 6.9 10*3/uL (ref 4.0–10.5)

## 2022-10-10 LAB — NITRIC OXIDE: Nitric Oxide: 18

## 2022-10-10 MED ORDER — FAMOTIDINE 20 MG PO TABS: ORAL_TABLET | ORAL | 11 refills | Status: DC

## 2022-10-10 MED ORDER — PANTOPRAZOLE SODIUM 40 MG PO TBEC: 40.0000 mg | DELAYED_RELEASE_TABLET | Freq: Every day | ORAL | 2 refills | Status: DC

## 2022-10-10 MED ORDER — TRAMADOL HCL 50 MG PO TABS: 50.0000 mg | ORAL_TABLET | ORAL | 0 refills | Status: AC | PRN

## 2022-10-10 NOTE — Patient Instructions (Addendum)
The key to effective treatment for your cough is eliminating the non-stop cycle of cough you're stuck in long enough to let your airway heal completely and then see if there is anything still making you cough once you stop the cough suppression, but this should take no more than 5 days to figure out  First take delsym otc  two tsp every 12 hours and supplement if needed with  tramadol 50 mg up to 2 every 4 hours to suppress the urge to cough at all or even clear your throat. Swallowing water or using ice chips/non mint and menthol containing candies (such as lifesavers or sugarless jolly ranchers) are also effective.  You should rest your voice and avoid activities that you know make you cough.  Once you have eliminated the cough for 3 straight days try reducing the tramadol first,  then the delsym as tolerated.    Prednisone 10 mg take  4 each am x 2 days,   2 each am x 2 days,  1 each am x 2 days and stop (this is to eliminate allergies and inflammation from coughing)  Protonix (pantoprazole) Take 30-60 min before first meal of the day and Pepcid 20 mg one bedtime plus chlorpheniramine 4 mg x 2 at bedtime ( available over the counter as "allergy relief" at [facility])  until cough is completely gone for at least a week without the need for cough suppression  GERD (REFLUX)  is an extremely common cause of respiratory symptoms, many times with no significant heartburn at all.    It can be treated with medication, but also with lifestyle changes including avoidance of late meals, excessive alcohol, smoking cessation, and avoid fatty foods, chocolate, peppermint, colas, red wine, and acidic juices such as orange juice.  NO MINT OR MENTHOL PRODUCTS SO NO COUGH DROPS  USE HARD CANDY INSTEAD (jolley ranchers or Stover's or Lifesavers (all available in sugarless versions) NO OIL BASED VITAMINS - use powdered substitutes.   If not better in 2 weeks call for referral to Dr Harriette Ohara at [facility].  Please  remember to go to the lab and  x-ray department  for your tests - we will call you with the results when they are available

## 2022-10-11 ENCOUNTER — Encounter: Payer: Self-pay | Admitting: Internal Medicine

## 2022-10-11 LAB — IGE: IgE (Immunoglobulin E), Serum: 507 kU/L — ABNORMAL HIGH (ref <=114)

## 2022-10-11 NOTE — Assessment & Plan Note (Signed)
Onset July 2023 p viral uri s/p multiple ant cx neck surgery Dr Yetta Barre  - Allergy screen 10/10/2022 >  Eos 0. /  IgE   - 10/10/2022  cyclical cough protocol and f/u with Dr Lynelle Doctor at [facility] if not better   Upper airway cough syndrome (previously labeled PNDS),  is so named because it's frequently impossible to sort out how much is  CR/sinusitis with freq throat clearing (which can be related to primary GERD)   vs  causing  secondary (" extra esophageal")  GERD from wide swings in gastric pressure that occur with throat clearing, often  promoting self use of mint and menthol lozenges that reduce the lower esophageal sphincter tone and exacerbate the problem further in a cyclical fashion.   These are the same pts (now being labeled as having "irritable larynx syndrome" -which may have been triggered by 2 prev neck surgeries) by some cough centers) who not infrequently have a history of having failed to tolerate ace inhibitors,  dry powder inhalers or biphosphonates or report having atypical/extraesophageal reflux symptoms that don't respond to standard doses of PPI  and are easily confused as having aecopd or asthma flares by even experienced allergists/ pulmonologists (myself included).   Of the three most common causes of  Sub-acute / recurrent or chronic cough, only one (GERD)  can actually contribute to/ trigger  the other two (asthma and post nasal drip syndrome)  and perpetuate the cylce of cough.  While not intuitively obvious, many patients with chronic low grade reflux do not cough until there is a primary insult that disturbs the protective epithelial barrier and exposes sensitive nerve endings.   This is typically viral but can due to PNDS and  either may apply here.   The point is that once this occurs, it is difficult to eliminate the cycle  using anything but a maximally effective acid suppression regimen at least in the short run, accompanied by an appropriate diet to address non acid GERD and  control / eliminate the cough itself for at least 3 days with tramadol and >>> also so added 6 day taper off  Prednisone starting at 40 mg per day in case of component of Th-2 driven upper or lower airways inflammation (if cough responds short term only to relapse before return while will on full rx for uacs (as above), then  that would point to allergic rhinitis/ asthma or eos bronchitis as alternative dx)   F/u can be prn in this clinic          Each maintenance medication was reviewed in detail including emphasizing most importantly the difference between maintenance and prns and under what circumstances the prns are to be triggered using an action plan format where appropriate.  Total time for H and P, chart review, counseling,  and generating customized AVS unique to this office visit / same day charting = 61 min new pt eval

## 2022-10-13 ENCOUNTER — Telehealth: Payer: Self-pay | Admitting: Internal Medicine

## 2022-10-13 NOTE — Progress Notes (Signed)
Called the pt and there was no answer- LMTCB    

## 2022-10-13 NOTE — Telephone Encounter (Signed)
Went over chest xray results with patient. She verbalized understanding. NFN

## 2022-10-13 NOTE — Telephone Encounter (Signed)
Patient is returning a call from the nurse regarding some test results.  Please advise and call patient back to discuss at [phone]

## 2022-10-17 DIAGNOSIS — R300 Dysuria: Secondary | ICD-10-CM | POA: Diagnosis not present

## 2022-10-18 ENCOUNTER — Other Ambulatory Visit: Payer: Self-pay | Admitting: Obstetrics and Gynecology

## 2022-10-18 DIAGNOSIS — R319 Hematuria, unspecified: Secondary | ICD-10-CM

## 2022-10-18 NOTE — Progress Notes (Signed)
ATC patient x2.  LVM to return call. °

## 2022-11-11 ENCOUNTER — Ambulatory Visit
Admission: RE | Admit: 2022-11-11 | Discharge: 2022-11-11 | Disposition: A | Discharge status: Home / Self Care | Payer: 59 | Source: Ambulatory Visit | Attending: Obstetrics and Gynecology | Admitting: Obstetrics and Gynecology

## 2022-11-11 DIAGNOSIS — R319 Hematuria, unspecified: Secondary | ICD-10-CM

## 2022-11-29 DIAGNOSIS — R319 Hematuria, unspecified: Secondary | ICD-10-CM | POA: Diagnosis not present

## 2022-11-29 DIAGNOSIS — R102 Pelvic and perineal pain: Secondary | ICD-10-CM | POA: Diagnosis not present

## 2022-11-29 DIAGNOSIS — R300 Dysuria: Secondary | ICD-10-CM | POA: Diagnosis not present

## 2022-11-29 DIAGNOSIS — M545 Low back pain, unspecified: Secondary | ICD-10-CM | POA: Diagnosis not present

## 2022-12-21 DIAGNOSIS — R351 Nocturia: Secondary | ICD-10-CM | POA: Diagnosis not present

## 2022-12-21 DIAGNOSIS — R350 Frequency of micturition: Secondary | ICD-10-CM | POA: Diagnosis not present

## 2022-12-21 DIAGNOSIS — R3121 Asymptomatic microscopic hematuria: Secondary | ICD-10-CM | POA: Diagnosis not present

## 2022-12-21 DIAGNOSIS — N200 Calculus of kidney: Secondary | ICD-10-CM | POA: Diagnosis not present

## 2022-12-30 DIAGNOSIS — K5730 Diverticulosis of large intestine without perforation or abscess without bleeding: Secondary | ICD-10-CM | POA: Diagnosis not present

## 2022-12-30 DIAGNOSIS — K449 Diaphragmatic hernia without obstruction or gangrene: Secondary | ICD-10-CM | POA: Diagnosis not present

## 2022-12-30 DIAGNOSIS — R3129 Other microscopic hematuria: Secondary | ICD-10-CM | POA: Diagnosis not present

## 2022-12-30 DIAGNOSIS — K760 Fatty (change of) liver, not elsewhere classified: Secondary | ICD-10-CM | POA: Diagnosis not present

## 2022-12-30 DIAGNOSIS — R3121 Asymptomatic microscopic hematuria: Secondary | ICD-10-CM | POA: Diagnosis not present

## 2022-12-31 ENCOUNTER — Other Ambulatory Visit: Payer: Self-pay | Admitting: Internal Medicine

## 2022-12-31 DIAGNOSIS — R058 Other specified cough: Secondary | ICD-10-CM

## 2023-01-04 DIAGNOSIS — R350 Frequency of micturition: Secondary | ICD-10-CM | POA: Diagnosis not present

## 2023-01-04 DIAGNOSIS — R3121 Asymptomatic microscopic hematuria: Secondary | ICD-10-CM | POA: Diagnosis not present

## 2023-02-22 ENCOUNTER — Other Ambulatory Visit: Payer: Self-pay | Admitting: Internal Medicine

## 2023-02-22 DIAGNOSIS — Z1231 Encounter for screening mammogram for malignant neoplasm of breast: Secondary | ICD-10-CM

## 2023-04-04 ENCOUNTER — Ambulatory Visit
Admission: RE | Admit: 2023-04-04 | Discharge: 2023-04-04 | Disposition: A | Discharge status: Home / Self Care | Payer: 59 | Source: Ambulatory Visit | Attending: Internal Medicine | Admitting: Internal Medicine

## 2023-04-04 ENCOUNTER — Other Ambulatory Visit: Payer: Self-pay | Admitting: Internal Medicine

## 2023-04-04 DIAGNOSIS — M545 Low back pain, unspecified: Secondary | ICD-10-CM

## 2023-04-04 DIAGNOSIS — R059 Cough, unspecified: Secondary | ICD-10-CM | POA: Diagnosis not present

## 2023-04-04 DIAGNOSIS — Z8616 Personal history of COVID-19: Secondary | ICD-10-CM | POA: Diagnosis not present

## 2023-04-06 ENCOUNTER — Ambulatory Visit
Admission: RE | Admit: 2023-04-06 | Discharge: 2023-04-06 | Disposition: A | Discharge status: Home / Self Care | Payer: 59 | Source: Ambulatory Visit | Attending: Internal Medicine | Admitting: Internal Medicine

## 2023-04-06 DIAGNOSIS — Z1231 Encounter for screening mammogram for malignant neoplasm of breast: Secondary | ICD-10-CM

## 2023-04-21 DIAGNOSIS — M545 Low back pain, unspecified: Secondary | ICD-10-CM | POA: Diagnosis not present

## 2023-05-02 DIAGNOSIS — M545 Low back pain, unspecified: Secondary | ICD-10-CM | POA: Diagnosis not present

## 2023-05-16 DIAGNOSIS — M545 Low back pain, unspecified: Secondary | ICD-10-CM | POA: Diagnosis not present

## 2023-06-13 DIAGNOSIS — I700 Atherosclerosis of aorta: Secondary | ICD-10-CM | POA: Diagnosis not present

## 2023-06-13 DIAGNOSIS — I10 Essential (primary) hypertension: Secondary | ICD-10-CM | POA: Diagnosis not present

## 2023-06-13 DIAGNOSIS — Z0000 Encounter for general adult medical examination without abnormal findings: Secondary | ICD-10-CM | POA: Diagnosis not present

## 2023-09-27 ENCOUNTER — Other Ambulatory Visit: Payer: Self-pay | Admitting: Internal Medicine

## 2023-09-27 DIAGNOSIS — R058 Other specified cough: Secondary | ICD-10-CM

## 2023-10-03 DIAGNOSIS — R87811 Vaginal high risk human papillomavirus (HPV) DNA test positive: Secondary | ICD-10-CM | POA: Diagnosis not present

## 2023-10-03 DIAGNOSIS — Z01419 Encounter for gynecological examination (general) (routine) without abnormal findings: Secondary | ICD-10-CM | POA: Diagnosis not present

## 2023-10-03 DIAGNOSIS — Z1272 Encounter for screening for malignant neoplasm of vagina: Secondary | ICD-10-CM | POA: Diagnosis not present

## 2023-10-12 DIAGNOSIS — I10 Essential (primary) hypertension: Secondary | ICD-10-CM | POA: Diagnosis not present

## 2023-10-12 DIAGNOSIS — K219 Gastro-esophageal reflux disease without esophagitis: Secondary | ICD-10-CM | POA: Diagnosis not present

## 2023-10-12 DIAGNOSIS — Z6833 Body mass index (BMI) 33.0-33.9, adult: Secondary | ICD-10-CM | POA: Diagnosis not present

## 2023-10-12 DIAGNOSIS — J309 Allergic rhinitis, unspecified: Secondary | ICD-10-CM | POA: Diagnosis not present

## 2023-10-12 DIAGNOSIS — G8929 Other chronic pain: Secondary | ICD-10-CM | POA: Diagnosis not present

## 2023-10-12 DIAGNOSIS — Z809 Family history of malignant neoplasm, unspecified: Secondary | ICD-10-CM | POA: Diagnosis not present

## 2023-10-12 DIAGNOSIS — Z8249 Family history of ischemic heart disease and other diseases of the circulatory system: Secondary | ICD-10-CM | POA: Diagnosis not present

## 2023-10-12 DIAGNOSIS — Z833 Family history of diabetes mellitus: Secondary | ICD-10-CM | POA: Diagnosis not present

## 2023-10-12 DIAGNOSIS — E785 Hyperlipidemia, unspecified: Secondary | ICD-10-CM | POA: Diagnosis not present

## 2023-10-12 DIAGNOSIS — E669 Obesity, unspecified: Secondary | ICD-10-CM | POA: Diagnosis not present

## 2023-10-31 DIAGNOSIS — R600 Localized edema: Secondary | ICD-10-CM | POA: Diagnosis not present

## 2023-10-31 DIAGNOSIS — M2550 Pain in unspecified joint: Secondary | ICD-10-CM | POA: Diagnosis not present

## 2023-11-17 DIAGNOSIS — R87622 Low grade squamous intraepithelial lesion on cytologic smear of vagina (LGSIL): Secondary | ICD-10-CM | POA: Diagnosis not present

## 2023-12-18 DIAGNOSIS — K625 Hemorrhage of anus and rectum: Secondary | ICD-10-CM | POA: Diagnosis not present

## 2024-01-16 DIAGNOSIS — M47816 Spondylosis without myelopathy or radiculopathy, lumbar region: Secondary | ICD-10-CM | POA: Diagnosis not present

## 2024-01-31 DIAGNOSIS — M47816 Spondylosis without myelopathy or radiculopathy, lumbar region: Secondary | ICD-10-CM | POA: Diagnosis not present

## 2024-02-15 DIAGNOSIS — M4726 Other spondylosis with radiculopathy, lumbar region: Secondary | ICD-10-CM | POA: Diagnosis not present

## 2024-03-06 DIAGNOSIS — M5416 Radiculopathy, lumbar region: Secondary | ICD-10-CM | POA: Diagnosis not present

## 2024-03-12 ENCOUNTER — Other Ambulatory Visit: Payer: Self-pay | Admitting: Internal Medicine

## 2024-03-12 DIAGNOSIS — Z1231 Encounter for screening mammogram for malignant neoplasm of breast: Secondary | ICD-10-CM

## 2024-04-09 ENCOUNTER — Ambulatory Visit
Admission: RE | Admit: 2024-04-09 | Discharge: 2024-04-09 | Disposition: A | Source: Ambulatory Visit | Attending: Internal Medicine | Admitting: Internal Medicine

## 2024-04-09 DIAGNOSIS — Z1231 Encounter for screening mammogram for malignant neoplasm of breast: Secondary | ICD-10-CM

## 2024-05-22 DIAGNOSIS — R87622 Low grade squamous intraepithelial lesion on cytologic smear of vagina (LGSIL): Secondary | ICD-10-CM | POA: Diagnosis not present

## 2024-05-22 DIAGNOSIS — R87811 Vaginal high risk human papillomavirus (HPV) DNA test positive: Secondary | ICD-10-CM | POA: Diagnosis not present
